# Patient Record
Sex: Female | Born: 1970
Health system: Southern US, Community
[De-identification: ages and names within clinical notes are randomized; demographics above are authoritative.]

## PROBLEM LIST (undated history)

## (undated) DIAGNOSIS — E039 Hypothyroidism, unspecified: Secondary | ICD-10-CM

## (undated) HISTORY — PX: FOOT SURGERY: SHX648

---

## 2001-10-31 ENCOUNTER — Other Ambulatory Visit: Admission: RE | Admit: 2001-10-31 | Discharge: 2001-10-31 | Payer: Self-pay | Admitting: Family Medicine

## 2003-11-23 ENCOUNTER — Other Ambulatory Visit: Admission: RE | Admit: 2003-11-23 | Discharge: 2003-11-23 | Payer: Self-pay | Admitting: Family Medicine

## 2004-12-23 ENCOUNTER — Other Ambulatory Visit: Admission: RE | Admit: 2004-12-23 | Discharge: 2004-12-23 | Payer: Self-pay | Admitting: Family Medicine

## 2006-02-05 ENCOUNTER — Other Ambulatory Visit: Admission: RE | Admit: 2006-02-05 | Discharge: 2006-02-05 | Payer: Self-pay | Admitting: Family Medicine

## 2007-03-18 ENCOUNTER — Other Ambulatory Visit: Admission: RE | Admit: 2007-03-18 | Discharge: 2007-03-18 | Payer: Self-pay | Admitting: Family Medicine

## 2008-03-31 ENCOUNTER — Other Ambulatory Visit: Admission: RE | Admit: 2008-03-31 | Discharge: 2008-03-31 | Payer: Self-pay | Admitting: Family Medicine

## 2009-05-03 ENCOUNTER — Other Ambulatory Visit: Admission: RE | Admit: 2009-05-03 | Discharge: 2009-05-03 | Payer: Self-pay | Admitting: Family Medicine

## 2009-05-25 ENCOUNTER — Encounter: Admission: RE | Admit: 2009-05-25 | Discharge: 2009-05-25 | Payer: Self-pay | Admitting: Family Medicine

## 2009-06-09 ENCOUNTER — Encounter: Admission: RE | Admit: 2009-06-09 | Discharge: 2009-06-09 | Payer: Self-pay | Admitting: Family Medicine

## 2009-06-09 ENCOUNTER — Encounter (INDEPENDENT_AMBULATORY_CARE_PROVIDER_SITE_OTHER): Payer: Self-pay | Admitting: Interventional Radiology

## 2009-06-09 ENCOUNTER — Other Ambulatory Visit: Admission: RE | Admit: 2009-06-09 | Discharge: 2009-06-09 | Payer: Self-pay | Admitting: Interventional Radiology

## 2009-11-02 ENCOUNTER — Other Ambulatory Visit: Admission: RE | Admit: 2009-11-02 | Discharge: 2009-11-02 | Payer: Self-pay | Admitting: Family Medicine

## 2010-03-02 IMAGING — US US BIOPSY
1 series · 6 of 6 positions shown · non-contrast
Comparison: none

CLINICAL DATA: Dominant left thyroid solid nodule.

ULTRASOUND-GUIDED THYROID ASPIRATION BIOPSY
TECHNIQUE: Survey ultrasound was performed and the dominant lesion
in the left lobe was localized.  An appropriate skin entry site was
determined.  Skin was marked, then prepped with Betadine, draped in
usual sterile fashion, and infiltrated locally with 1% lidocaine.
Under real-time ultrasound guidance, 4  passes were made into the
lesion with 25 gauge needles.  The patient tolerated procedure
well, with no immediate complications.
IMPRESSION
1.  Technically successful ultrasound-guided thyroid aspiration
biopsy

[Series 1: us biopsy · 6 acquisitions, 6 frames shown]
[im 1/6]
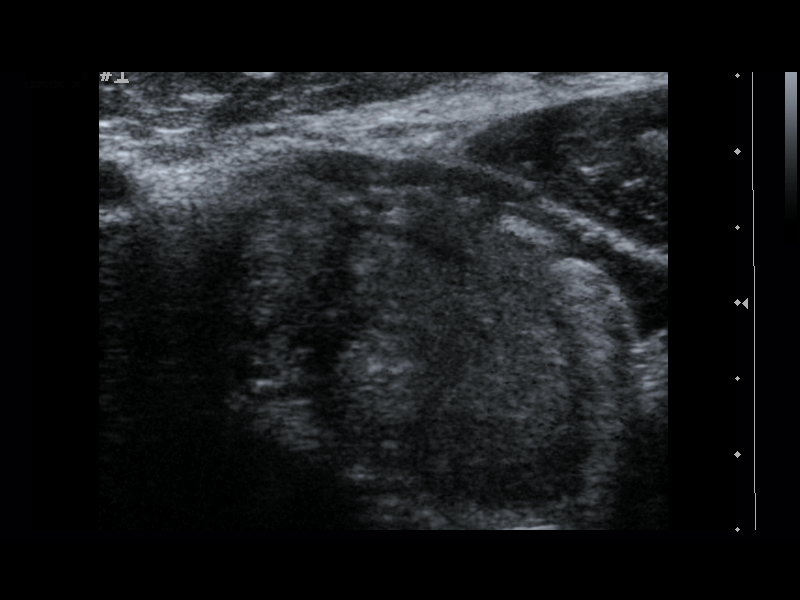
[im 2/6]
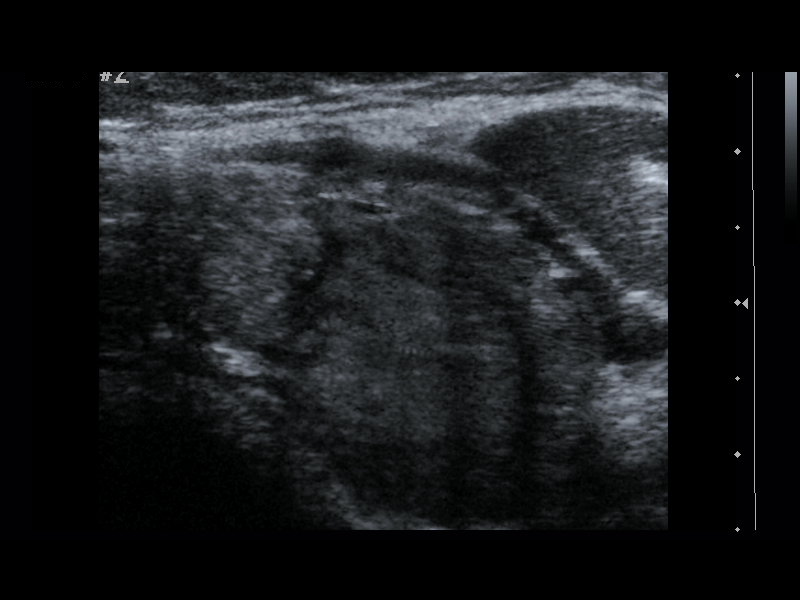
[im 3/6]
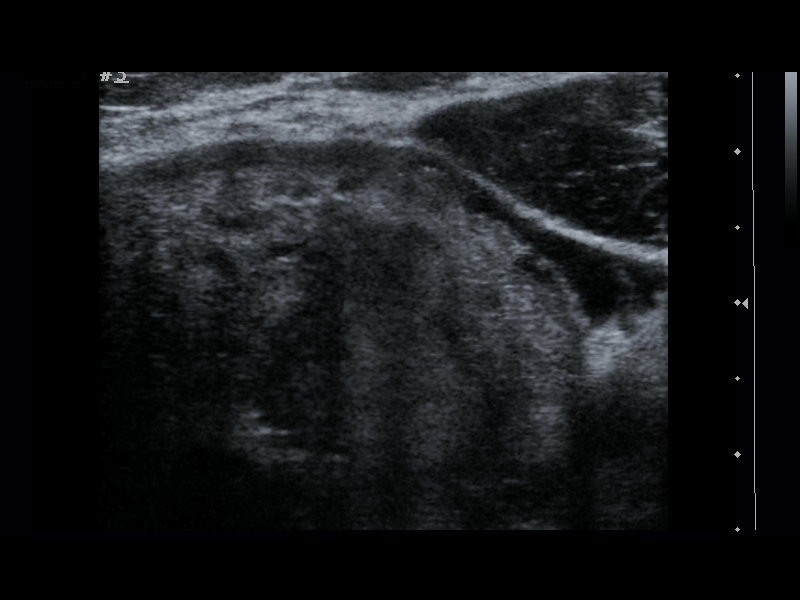
[im 4/6]
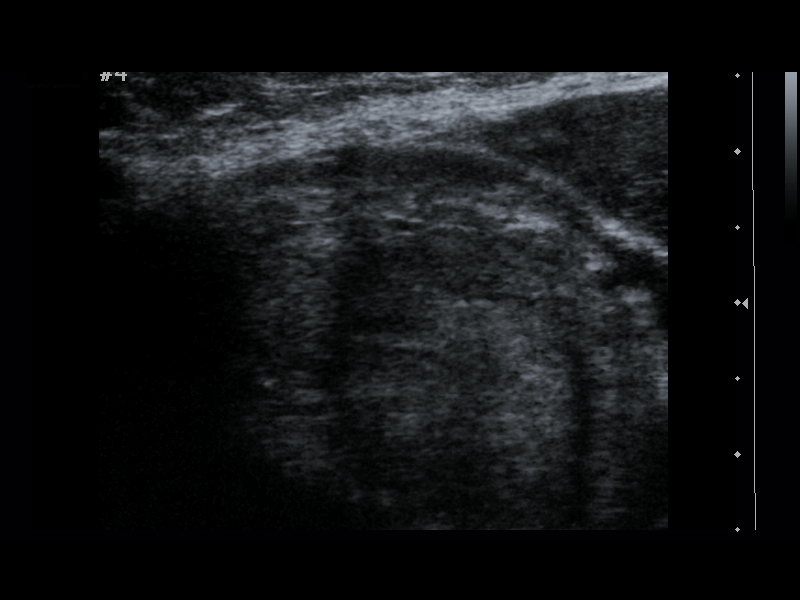
[im 5/6]
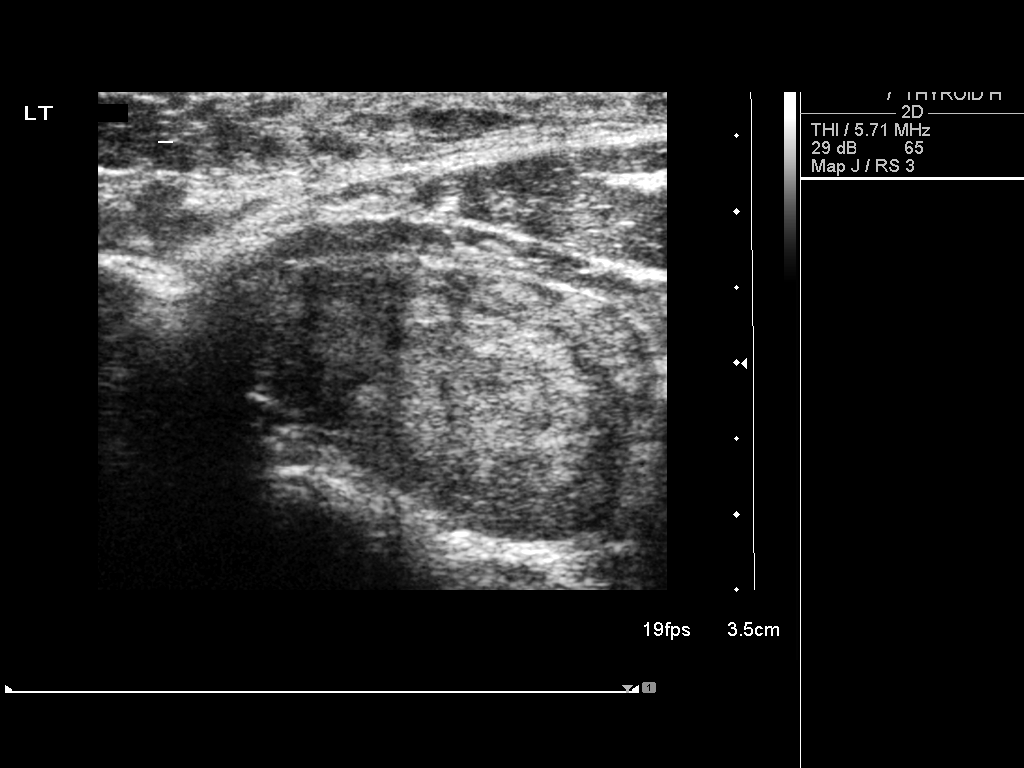
[im 6/6]
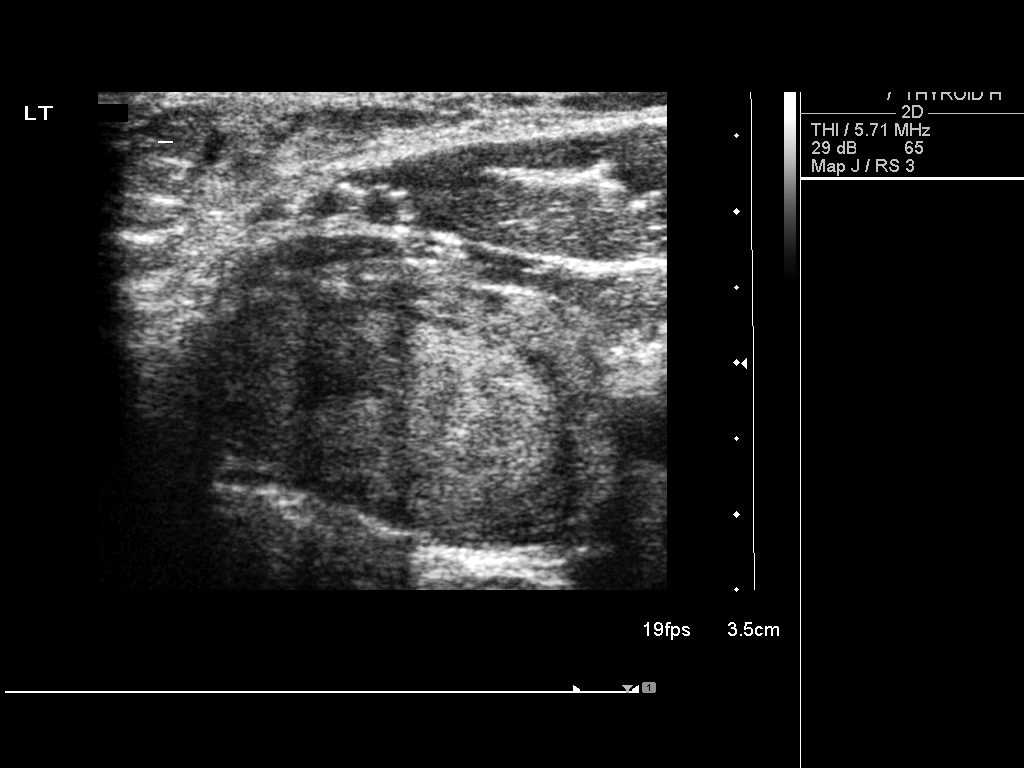

[6 of 6 positions shown; findings below may reference images not displayed]

## 2010-05-04 ENCOUNTER — Other Ambulatory Visit: Admission: RE | Admit: 2010-05-04 | Discharge: 2010-05-04 | Payer: Self-pay | Admitting: Family Medicine

## 2011-05-08 ENCOUNTER — Other Ambulatory Visit (HOSPITAL_COMMUNITY)
Admission: RE | Admit: 2011-05-08 | Discharge: 2011-05-08 | Disposition: A | Discharge status: Home / Self Care | Payer: BC Managed Care – PPO | Source: Ambulatory Visit | Attending: Family Medicine | Admitting: Family Medicine

## 2011-05-08 ENCOUNTER — Other Ambulatory Visit: Payer: Self-pay | Admitting: Physician Assistant

## 2011-05-08 DIAGNOSIS — Z124 Encounter for screening for malignant neoplasm of cervix: Secondary | ICD-10-CM | POA: Insufficient documentation

## 2011-05-11 ENCOUNTER — Other Ambulatory Visit: Payer: Self-pay | Admitting: Physician Assistant

## 2011-05-11 DIAGNOSIS — Z1231 Encounter for screening mammogram for malignant neoplasm of breast: Secondary | ICD-10-CM

## 2011-05-29 ENCOUNTER — Ambulatory Visit
Admission: RE | Admit: 2011-05-29 | Discharge: 2011-05-29 | Disposition: A | Discharge status: Home / Self Care | Payer: BC Managed Care – PPO | Source: Ambulatory Visit | Attending: Physician Assistant | Admitting: Physician Assistant

## 2011-05-29 DIAGNOSIS — Z1231 Encounter for screening mammogram for malignant neoplasm of breast: Secondary | ICD-10-CM

## 2011-05-31 ENCOUNTER — Other Ambulatory Visit: Payer: Self-pay | Admitting: Physician Assistant

## 2011-05-31 DIAGNOSIS — R928 Other abnormal and inconclusive findings on diagnostic imaging of breast: Secondary | ICD-10-CM

## 2011-06-06 ENCOUNTER — Ambulatory Visit
Admission: RE | Admit: 2011-06-06 | Discharge: 2011-06-06 | Disposition: A | Discharge status: Home / Self Care | Payer: BC Managed Care – PPO | Source: Ambulatory Visit | Attending: Physician Assistant | Admitting: Physician Assistant

## 2011-06-06 DIAGNOSIS — R928 Other abnormal and inconclusive findings on diagnostic imaging of breast: Secondary | ICD-10-CM

## 2012-05-16 ENCOUNTER — Other Ambulatory Visit (HOSPITAL_COMMUNITY)
Admission: RE | Admit: 2012-05-16 | Discharge: 2012-05-16 | Disposition: A | Discharge status: Home / Self Care | Payer: BC Managed Care – PPO | Source: Ambulatory Visit | Attending: Family Medicine | Admitting: Family Medicine

## 2012-05-16 ENCOUNTER — Other Ambulatory Visit: Payer: Self-pay | Admitting: Physician Assistant

## 2012-05-16 ENCOUNTER — Other Ambulatory Visit: Payer: Self-pay | Admitting: Family Medicine

## 2012-05-16 DIAGNOSIS — Z1231 Encounter for screening mammogram for malignant neoplasm of breast: Secondary | ICD-10-CM

## 2012-05-16 DIAGNOSIS — Z124 Encounter for screening for malignant neoplasm of cervix: Secondary | ICD-10-CM | POA: Insufficient documentation

## 2012-06-10 ENCOUNTER — Ambulatory Visit
Admission: RE | Admit: 2012-06-10 | Discharge: 2012-06-10 | Disposition: A | Discharge status: Home / Self Care | Payer: BC Managed Care – PPO | Source: Ambulatory Visit | Attending: Family Medicine | Admitting: Family Medicine

## 2012-06-10 DIAGNOSIS — Z1231 Encounter for screening mammogram for malignant neoplasm of breast: Secondary | ICD-10-CM

## 2013-05-07 ENCOUNTER — Other Ambulatory Visit: Payer: Self-pay

## 2013-05-07 DIAGNOSIS — Z1231 Encounter for screening mammogram for malignant neoplasm of breast: Secondary | ICD-10-CM

## 2013-06-06 ENCOUNTER — Other Ambulatory Visit: Payer: Self-pay | Admitting: Physician Assistant

## 2013-06-06 ENCOUNTER — Other Ambulatory Visit (HOSPITAL_COMMUNITY)
Admission: RE | Admit: 2013-06-06 | Discharge: 2013-06-06 | Disposition: A | Discharge status: Home / Self Care | Payer: BC Managed Care – PPO | Source: Ambulatory Visit | Attending: Family Medicine | Admitting: Family Medicine

## 2013-06-06 DIAGNOSIS — Z124 Encounter for screening for malignant neoplasm of cervix: Secondary | ICD-10-CM | POA: Insufficient documentation

## 2013-06-13 ENCOUNTER — Ambulatory Visit
Admission: RE | Admit: 2013-06-13 | Discharge: 2013-06-13 | Disposition: A | Discharge status: Home / Self Care | Payer: BC Managed Care – PPO | Source: Ambulatory Visit

## 2013-06-13 DIAGNOSIS — Z1231 Encounter for screening mammogram for malignant neoplasm of breast: Secondary | ICD-10-CM

## 2014-05-04 ENCOUNTER — Other Ambulatory Visit: Payer: Self-pay

## 2014-05-04 DIAGNOSIS — Z1231 Encounter for screening mammogram for malignant neoplasm of breast: Secondary | ICD-10-CM

## 2014-06-09 ENCOUNTER — Other Ambulatory Visit: Payer: Self-pay | Admitting: Physician Assistant

## 2014-06-09 ENCOUNTER — Other Ambulatory Visit (HOSPITAL_COMMUNITY)
Admission: RE | Admit: 2014-06-09 | Discharge: 2014-06-09 | Disposition: A | Discharge status: Home / Self Care | Payer: BC Managed Care – PPO | Source: Ambulatory Visit | Attending: Family Medicine | Admitting: Family Medicine

## 2014-06-09 DIAGNOSIS — Z124 Encounter for screening for malignant neoplasm of cervix: Secondary | ICD-10-CM | POA: Insufficient documentation

## 2014-06-10 LAB — CYTOLOGY - PAP

## 2014-06-15 ENCOUNTER — Ambulatory Visit
Admission: RE | Admit: 2014-06-15 | Discharge: 2014-06-15 | Disposition: A | Discharge status: Home / Self Care | Payer: BC Managed Care – PPO | Source: Ambulatory Visit

## 2014-06-15 DIAGNOSIS — Z1231 Encounter for screening mammogram for malignant neoplasm of breast: Secondary | ICD-10-CM

## 2015-05-07 ENCOUNTER — Other Ambulatory Visit: Payer: Self-pay

## 2015-05-07 DIAGNOSIS — Z1231 Encounter for screening mammogram for malignant neoplasm of breast: Secondary | ICD-10-CM

## 2015-06-17 ENCOUNTER — Ambulatory Visit
Admission: RE | Admit: 2015-06-17 | Discharge: 2015-06-17 | Disposition: A | Discharge status: Home / Self Care | Payer: BLUE CROSS/BLUE SHIELD | Source: Ambulatory Visit

## 2015-06-17 DIAGNOSIS — Z1231 Encounter for screening mammogram for malignant neoplasm of breast: Secondary | ICD-10-CM

## 2016-05-23 ENCOUNTER — Other Ambulatory Visit: Payer: Self-pay | Admitting: Physician Assistant

## 2016-05-23 DIAGNOSIS — Z1231 Encounter for screening mammogram for malignant neoplasm of breast: Secondary | ICD-10-CM

## 2016-06-15 ENCOUNTER — Other Ambulatory Visit (HOSPITAL_COMMUNITY)
Admission: RE | Admit: 2016-06-15 | Discharge: 2016-06-15 | Disposition: A | Discharge status: Home / Self Care | Payer: BLUE CROSS/BLUE SHIELD | Source: Ambulatory Visit | Attending: Family Medicine | Admitting: Family Medicine

## 2016-06-15 ENCOUNTER — Other Ambulatory Visit: Payer: Self-pay | Admitting: Physician Assistant

## 2016-06-15 DIAGNOSIS — Z124 Encounter for screening for malignant neoplasm of cervix: Secondary | ICD-10-CM | POA: Diagnosis not present

## 2016-06-20 ENCOUNTER — Ambulatory Visit
Admission: RE | Admit: 2016-06-20 | Discharge: 2016-06-20 | Disposition: A | Discharge status: Home / Self Care | Payer: BLUE CROSS/BLUE SHIELD | Source: Ambulatory Visit | Attending: Physician Assistant | Admitting: Physician Assistant

## 2016-06-20 DIAGNOSIS — Z1231 Encounter for screening mammogram for malignant neoplasm of breast: Secondary | ICD-10-CM

## 2016-06-20 LAB — CYTOLOGY - PAP

## 2017-05-14 ENCOUNTER — Other Ambulatory Visit: Payer: Self-pay | Admitting: Physician Assistant

## 2017-05-14 DIAGNOSIS — Z1231 Encounter for screening mammogram for malignant neoplasm of breast: Secondary | ICD-10-CM

## 2017-05-22 ENCOUNTER — Ambulatory Visit: Payer: BLUE CROSS/BLUE SHIELD

## 2017-06-22 ENCOUNTER — Ambulatory Visit
Admission: RE | Admit: 2017-06-22 | Discharge: 2017-06-22 | Disposition: A | Discharge status: Home / Self Care | Payer: BLUE CROSS/BLUE SHIELD | Source: Ambulatory Visit | Attending: Physician Assistant | Admitting: Physician Assistant

## 2017-06-22 DIAGNOSIS — Z1231 Encounter for screening mammogram for malignant neoplasm of breast: Secondary | ICD-10-CM

## 2018-05-15 ENCOUNTER — Other Ambulatory Visit: Payer: Self-pay | Admitting: Physician Assistant

## 2018-05-15 DIAGNOSIS — Z1231 Encounter for screening mammogram for malignant neoplasm of breast: Secondary | ICD-10-CM

## 2018-06-27 ENCOUNTER — Ambulatory Visit
Admission: RE | Admit: 2018-06-27 | Discharge: 2018-06-27 | Disposition: A | Discharge status: Home / Self Care | Payer: BLUE CROSS/BLUE SHIELD | Source: Ambulatory Visit | Attending: Physician Assistant | Admitting: Physician Assistant

## 2018-06-27 DIAGNOSIS — Z1231 Encounter for screening mammogram for malignant neoplasm of breast: Secondary | ICD-10-CM | POA: Diagnosis not present

## 2018-07-09 DIAGNOSIS — E039 Hypothyroidism, unspecified: Secondary | ICD-10-CM | POA: Diagnosis not present

## 2018-07-09 DIAGNOSIS — N912 Amenorrhea, unspecified: Secondary | ICD-10-CM | POA: Diagnosis not present

## 2018-07-09 DIAGNOSIS — R7302 Impaired glucose tolerance (oral): Secondary | ICD-10-CM | POA: Diagnosis not present

## 2018-07-09 DIAGNOSIS — Z13220 Encounter for screening for lipoid disorders: Secondary | ICD-10-CM | POA: Diagnosis not present

## 2018-07-09 DIAGNOSIS — Z23 Encounter for immunization: Secondary | ICD-10-CM | POA: Diagnosis not present

## 2018-07-09 DIAGNOSIS — E7800 Pure hypercholesterolemia, unspecified: Secondary | ICD-10-CM | POA: Diagnosis not present

## 2018-07-09 DIAGNOSIS — Z0000 Encounter for general adult medical examination without abnormal findings: Secondary | ICD-10-CM | POA: Diagnosis not present

## 2018-12-19 DIAGNOSIS — H40013 Open angle with borderline findings, low risk, bilateral: Secondary | ICD-10-CM | POA: Diagnosis not present

## 2018-12-26 DIAGNOSIS — H40013 Open angle with borderline findings, low risk, bilateral: Secondary | ICD-10-CM | POA: Diagnosis not present

## 2019-01-07 DIAGNOSIS — E039 Hypothyroidism, unspecified: Secondary | ICD-10-CM | POA: Diagnosis not present

## 2019-01-07 DIAGNOSIS — R7302 Impaired glucose tolerance (oral): Secondary | ICD-10-CM | POA: Diagnosis not present

## 2019-01-07 DIAGNOSIS — E7800 Pure hypercholesterolemia, unspecified: Secondary | ICD-10-CM | POA: Diagnosis not present

## 2019-05-19 ENCOUNTER — Other Ambulatory Visit: Payer: Self-pay | Admitting: Physician Assistant

## 2019-05-19 DIAGNOSIS — Z1231 Encounter for screening mammogram for malignant neoplasm of breast: Secondary | ICD-10-CM

## 2019-07-03 ENCOUNTER — Other Ambulatory Visit: Payer: Self-pay

## 2019-07-03 ENCOUNTER — Ambulatory Visit
Admission: RE | Admit: 2019-07-03 | Discharge: 2019-07-03 | Disposition: A | Discharge status: Home / Self Care | Payer: BLUE CROSS/BLUE SHIELD | Source: Ambulatory Visit | Attending: Physician Assistant | Admitting: Physician Assistant

## 2019-07-03 DIAGNOSIS — Z1231 Encounter for screening mammogram for malignant neoplasm of breast: Secondary | ICD-10-CM

## 2019-07-11 DIAGNOSIS — E039 Hypothyroidism, unspecified: Secondary | ICD-10-CM | POA: Diagnosis not present

## 2019-07-11 DIAGNOSIS — R7302 Impaired glucose tolerance (oral): Secondary | ICD-10-CM | POA: Diagnosis not present

## 2019-07-11 DIAGNOSIS — E7800 Pure hypercholesterolemia, unspecified: Secondary | ICD-10-CM | POA: Diagnosis not present

## 2019-07-11 DIAGNOSIS — Z0000 Encounter for general adult medical examination without abnormal findings: Secondary | ICD-10-CM | POA: Diagnosis not present

## 2019-07-18 DIAGNOSIS — E039 Hypothyroidism, unspecified: Secondary | ICD-10-CM | POA: Diagnosis not present

## 2019-07-18 DIAGNOSIS — E7800 Pure hypercholesterolemia, unspecified: Secondary | ICD-10-CM | POA: Diagnosis not present

## 2019-07-18 DIAGNOSIS — Z23 Encounter for immunization: Secondary | ICD-10-CM | POA: Diagnosis not present

## 2019-07-18 DIAGNOSIS — R7302 Impaired glucose tolerance (oral): Secondary | ICD-10-CM | POA: Diagnosis not present

## 2019-09-22 DIAGNOSIS — Z1159 Encounter for screening for other viral diseases: Secondary | ICD-10-CM | POA: Diagnosis not present

## 2019-09-25 DIAGNOSIS — K635 Polyp of colon: Secondary | ICD-10-CM | POA: Diagnosis not present

## 2019-09-25 DIAGNOSIS — Z1211 Encounter for screening for malignant neoplasm of colon: Secondary | ICD-10-CM | POA: Diagnosis not present

## 2019-12-17 DIAGNOSIS — H40013 Open angle with borderline findings, low risk, bilateral: Secondary | ICD-10-CM | POA: Diagnosis not present

## 2020-01-08 DIAGNOSIS — R7302 Impaired glucose tolerance (oral): Secondary | ICD-10-CM | POA: Diagnosis not present

## 2020-01-08 DIAGNOSIS — E039 Hypothyroidism, unspecified: Secondary | ICD-10-CM | POA: Diagnosis not present

## 2020-01-08 DIAGNOSIS — E7800 Pure hypercholesterolemia, unspecified: Secondary | ICD-10-CM | POA: Diagnosis not present

## 2020-06-24 ENCOUNTER — Other Ambulatory Visit: Payer: Self-pay | Admitting: Physician Assistant

## 2020-06-24 DIAGNOSIS — Z1231 Encounter for screening mammogram for malignant neoplasm of breast: Secondary | ICD-10-CM

## 2020-07-08 ENCOUNTER — Other Ambulatory Visit: Payer: Self-pay

## 2020-07-08 ENCOUNTER — Ambulatory Visit
Admission: RE | Admit: 2020-07-08 | Discharge: 2020-07-08 | Disposition: A | Discharge status: Home / Self Care | Payer: BC Managed Care – PPO | Source: Ambulatory Visit | Attending: Physician Assistant | Admitting: Physician Assistant

## 2020-07-08 DIAGNOSIS — Z1231 Encounter for screening mammogram for malignant neoplasm of breast: Secondary | ICD-10-CM | POA: Diagnosis not present

## 2020-07-13 ENCOUNTER — Other Ambulatory Visit (HOSPITAL_COMMUNITY)
Admission: RE | Admit: 2020-07-13 | Discharge: 2020-07-13 | Disposition: A | Discharge status: Home / Self Care | Payer: BC Managed Care – PPO | Source: Ambulatory Visit | Attending: Physician Assistant | Admitting: Physician Assistant

## 2020-07-13 DIAGNOSIS — Z124 Encounter for screening for malignant neoplasm of cervix: Secondary | ICD-10-CM | POA: Diagnosis not present

## 2020-07-13 DIAGNOSIS — E039 Hypothyroidism, unspecified: Secondary | ICD-10-CM | POA: Diagnosis not present

## 2020-07-13 DIAGNOSIS — Z23 Encounter for immunization: Secondary | ICD-10-CM | POA: Diagnosis not present

## 2020-07-13 DIAGNOSIS — E7800 Pure hypercholesterolemia, unspecified: Secondary | ICD-10-CM | POA: Diagnosis not present

## 2020-07-13 DIAGNOSIS — Z0000 Encounter for general adult medical examination without abnormal findings: Secondary | ICD-10-CM | POA: Diagnosis not present

## 2020-07-13 DIAGNOSIS — R7302 Impaired glucose tolerance (oral): Secondary | ICD-10-CM | POA: Diagnosis not present

## 2020-07-15 LAB — CYTOLOGY - PAP
Diagnosis: NEGATIVE
Diagnosis: REACTIVE

## 2020-09-14 DIAGNOSIS — Z23 Encounter for immunization: Secondary | ICD-10-CM | POA: Diagnosis not present

## 2021-01-11 DIAGNOSIS — E7800 Pure hypercholesterolemia, unspecified: Secondary | ICD-10-CM | POA: Diagnosis not present

## 2021-01-11 DIAGNOSIS — E039 Hypothyroidism, unspecified: Secondary | ICD-10-CM | POA: Diagnosis not present

## 2021-01-11 DIAGNOSIS — R7302 Impaired glucose tolerance (oral): Secondary | ICD-10-CM | POA: Diagnosis not present

## 2021-04-18 DIAGNOSIS — R7302 Impaired glucose tolerance (oral): Secondary | ICD-10-CM | POA: Diagnosis not present

## 2021-07-14 ENCOUNTER — Other Ambulatory Visit: Payer: Self-pay | Admitting: Physician Assistant

## 2021-07-14 DIAGNOSIS — E7800 Pure hypercholesterolemia, unspecified: Secondary | ICD-10-CM | POA: Diagnosis not present

## 2021-07-14 DIAGNOSIS — E039 Hypothyroidism, unspecified: Secondary | ICD-10-CM | POA: Diagnosis not present

## 2021-07-14 DIAGNOSIS — Z0000 Encounter for general adult medical examination without abnormal findings: Secondary | ICD-10-CM | POA: Diagnosis not present

## 2021-07-14 DIAGNOSIS — R7302 Impaired glucose tolerance (oral): Secondary | ICD-10-CM | POA: Diagnosis not present

## 2021-07-14 DIAGNOSIS — Z1231 Encounter for screening mammogram for malignant neoplasm of breast: Secondary | ICD-10-CM

## 2021-07-14 DIAGNOSIS — Z23 Encounter for immunization: Secondary | ICD-10-CM | POA: Diagnosis not present

## 2021-08-18 ENCOUNTER — Other Ambulatory Visit: Payer: Self-pay

## 2021-08-18 ENCOUNTER — Ambulatory Visit
Admission: RE | Admit: 2021-08-18 | Discharge: 2021-08-18 | Disposition: A | Discharge status: Home / Self Care | Payer: BC Managed Care – PPO | Source: Ambulatory Visit | Attending: Physician Assistant | Admitting: Physician Assistant

## 2021-08-18 DIAGNOSIS — Z1231 Encounter for screening mammogram for malignant neoplasm of breast: Secondary | ICD-10-CM | POA: Diagnosis not present

## 2021-08-25 ENCOUNTER — Other Ambulatory Visit: Payer: Self-pay | Admitting: Physician Assistant

## 2021-08-25 DIAGNOSIS — R928 Other abnormal and inconclusive findings on diagnostic imaging of breast: Secondary | ICD-10-CM

## 2021-09-06 ENCOUNTER — Other Ambulatory Visit: Payer: Self-pay | Admitting: Physician Assistant

## 2021-09-06 ENCOUNTER — Ambulatory Visit
Admission: RE | Admit: 2021-09-06 | Discharge: 2021-09-06 | Disposition: A | Discharge status: Home / Self Care | Payer: BC Managed Care – PPO | Source: Ambulatory Visit | Attending: Physician Assistant | Admitting: Physician Assistant

## 2021-09-06 ENCOUNTER — Other Ambulatory Visit: Payer: Self-pay

## 2021-09-06 DIAGNOSIS — R928 Other abnormal and inconclusive findings on diagnostic imaging of breast: Secondary | ICD-10-CM

## 2021-09-06 DIAGNOSIS — R2231 Localized swelling, mass and lump, right upper limb: Secondary | ICD-10-CM | POA: Diagnosis not present

## 2021-09-15 ENCOUNTER — Other Ambulatory Visit: Payer: Self-pay

## 2021-09-15 ENCOUNTER — Ambulatory Visit
Admission: RE | Admit: 2021-09-15 | Discharge: 2021-09-15 | Disposition: A | Discharge status: Home / Self Care | Payer: BC Managed Care – PPO | Source: Ambulatory Visit | Attending: Physician Assistant | Admitting: Physician Assistant

## 2021-09-15 DIAGNOSIS — R928 Other abnormal and inconclusive findings on diagnostic imaging of breast: Secondary | ICD-10-CM

## 2021-09-15 DIAGNOSIS — R2231 Localized swelling, mass and lump, right upper limb: Secondary | ICD-10-CM | POA: Diagnosis not present

## 2021-09-15 DIAGNOSIS — I899 Noninfective disorder of lymphatic vessels and lymph nodes, unspecified: Secondary | ICD-10-CM | POA: Diagnosis not present

## 2021-10-26 ENCOUNTER — Other Ambulatory Visit: Payer: Self-pay | Admitting: Surgery

## 2021-10-26 DIAGNOSIS — R2231 Localized swelling, mass and lump, right upper limb: Secondary | ICD-10-CM | POA: Diagnosis not present

## 2021-11-28 ENCOUNTER — Encounter (HOSPITAL_BASED_OUTPATIENT_CLINIC_OR_DEPARTMENT_OTHER): Payer: Self-pay | Admitting: Surgery

## 2021-12-06 MED ORDER — ENSURE PRE-SURGERY PO LIQD: 296.0000 mL | Freq: Once | ORAL | Status: DC

## 2021-12-06 NOTE — Anesthesia Preprocedure Evaluation (Addendum)
Anesthesia Evaluation  Patient identified by MRN, date of birth, ID band Patient awake    Reviewed: Allergy & Precautions, NPO status , Patient's Chart, lab work & pertinent test results  History of Anesthesia Complications Negative for: history of anesthetic complications  Airway Mallampati: II  TM Distance: >3 FB Neck ROM: Full    Dental no notable dental hx.    Pulmonary neg pulmonary ROS,    Pulmonary exam normal        Cardiovascular negative cardio ROS Normal cardiovascular exam     Neuro/Psych negative neurological ROS  negative psych ROS   GI/Hepatic negative GI ROS, Neg liver ROS,   Endo/Other  Hypothyroidism   Renal/GU negative Renal ROS  negative genitourinary   Musculoskeletal negative musculoskeletal ROS (+)   Abdominal   Peds  Hematology negative hematology ROS (+)   Anesthesia Other Findings Right axillary mass  Reproductive/Obstetrics negative OB ROS                            Anesthesia Physical Anesthesia Plan  ASA: 2  Anesthesia Plan: General   Post-op Pain Management: Tylenol PO (pre-op)   Induction: Intravenous  PONV Risk Score and Plan: 3 and Treatment may vary due to age or medical condition, Ondansetron, Dexamethasone and Midazolam  Airway Management Planned: LMA  Additional Equipment: None  Intra-op Plan:   Post-operative Plan: Extubation in OR  Informed Consent: I have reviewed the patients History and Physical, chart, labs and discussed the procedure including the risks, benefits and alternatives for the proposed anesthesia with the patient or authorized representative who has indicated his/her understanding and acceptance.     Dental advisory given  Plan Discussed with: CRNA  Anesthesia Plan Comments:        Anesthesia Quick Evaluation

## 2021-12-06 NOTE — Progress Notes (Signed)

## 2021-12-06 NOTE — H&P (Signed)
°  REFERRING PHYSICIAN: Alyson Ingles, PA  PROVIDER: Wayne Both, MD  MRN: [id number] DOB: [birth year]  Subjective   Chief Complaint: Sebaceous Cyst   History of Present Illness: Amy Weaver is a 51 y.o. female who is seen  as an office consultation at the request of Dr. Sherlyn Lick for evaluation of Sebaceous Cyst .   This is a 51 year old female presents with a right axillary mass. She reports that the masses been present for several years but is now getting larger. It was visualized on most recent screening mammography. It was found on mammogram and ultrasound to be 5.2 cm in size. She underwent a biopsy of the mass which was fairly inconclusive. She has had no problems with her breasts. She reports this area is never drained or become erythematous. She denies fevers or chills  Review of Systems: A complete review of systems was obtained from the patient. I have reviewed this information and discussed as appropriate with the patient. See HPI as well for other ROS.  ROS   Medical History: Past Medical History:  Diagnosis Date   Thyroid disease   There is no problem list on file for this patient.  History reviewed. No pertinent surgical history.   No Known Allergies  Current Outpatient Medications on File Prior to Visit  Medication Sig Dispense Refill   atorvastatin (LIPITOR) 20 MG tablet Take 1 tablet by mouth once daily   levothyroxine (SYNTHROID) 125 MCG tablet TAKE 1 TABLET EVERY MORNING ON AN EMPTY STOMACH ONCE A DAY   No current facility-administered medications on file prior to visit.   Family History  Problem Relation Age of Onset   Breast cancer Mother    Social History   Tobacco Use  Smoking Status Never  Smokeless Tobacco Never    Social History   Socioeconomic History   Marital status: Married  Tobacco Use   Smoking status: Never   Smokeless tobacco: Never  Substance and Sexual Activity   Alcohol use: Not Currently   Drug use: Not  Currently   Objective:   Vitals:   BP: 128/82  Pulse: 82  Weight: 94.3 kg (207 lb 12.8 oz)  Height: 167.6 cm (5\' 6" )   Body mass index is 33.54 kg/m.  Physical Exam   She appears well on exam  In the right axilla posteriorly there is a 5 cm mass which is mildly mobile. There are no skin changes. It is nonpulsatile. There are no other axillary masses  Lungs clear CV RRR  Labs, Imaging and Diagnostic Testing: I reviewed her mammogram, ultrasound, and pathology results  Assessment and Plan:   Diagnoses and all orders for this visit:  Axillary mass, right    I have discussed the findings in detail with the patient and her family. Given the size of this mass and its significant increase over time especially based on mammogram, its discomfort, pathology, etc., excision of the mass is strongly recommended for complete histologic evaluation to rule out a malignancy. I discussed the procedure with her in detail. We discussed the risk which includes but is not limited to bleeding, infection, recurrence, the need for other procedures if malignancy is found, cardiopulmonary issues, postoperative recovery, etc. They understand and wish to proceed with surgery which will be scheduled.

## 2021-12-07 ENCOUNTER — Encounter (HOSPITAL_BASED_OUTPATIENT_CLINIC_OR_DEPARTMENT_OTHER): Payer: Self-pay | Admitting: Surgery

## 2021-12-07 ENCOUNTER — Other Ambulatory Visit: Payer: Self-pay

## 2021-12-07 ENCOUNTER — Ambulatory Visit (HOSPITAL_BASED_OUTPATIENT_CLINIC_OR_DEPARTMENT_OTHER)
Admission: RE | Admit: 2021-12-07 | Discharge: 2021-12-07 | Disposition: A | Discharge status: Home / Self Care | Payer: BC Managed Care – PPO | Attending: Surgery | Admitting: Surgery

## 2021-12-07 ENCOUNTER — Encounter (HOSPITAL_BASED_OUTPATIENT_CLINIC_OR_DEPARTMENT_OTHER)
Admission: RE | Disposition: A | Discharge status: Home / Self Care | Payer: Self-pay | Source: Home / Self Care | Attending: Surgery

## 2021-12-07 ENCOUNTER — Ambulatory Visit (HOSPITAL_BASED_OUTPATIENT_CLINIC_OR_DEPARTMENT_OTHER): Payer: BC Managed Care – PPO | Admitting: Anesthesiology

## 2021-12-07 DIAGNOSIS — L720 Epidermal cyst: Secondary | ICD-10-CM | POA: Insufficient documentation

## 2021-12-07 DIAGNOSIS — R222 Localized swelling, mass and lump, trunk: Secondary | ICD-10-CM | POA: Diagnosis not present

## 2021-12-07 DIAGNOSIS — E039 Hypothyroidism, unspecified: Secondary | ICD-10-CM | POA: Diagnosis not present

## 2021-12-07 DIAGNOSIS — L723 Sebaceous cyst: Secondary | ICD-10-CM | POA: Diagnosis not present

## 2021-12-07 HISTORY — PX: MASS EXCISION: SHX2000

## 2021-12-07 HISTORY — DX: Hypothyroidism, unspecified: E03.9

## 2021-12-07 SURGERY — EXCISION MASS
Anesthesia: General | Site: Axilla | Laterality: Right

## 2021-12-07 MED ORDER — FENTANYL CITRATE (PF) 100 MCG/2ML IJ SOLN
INTRAMUSCULAR | Status: AC
  Filled 2021-12-07: qty 2

## 2021-12-07 MED ORDER — BUPIVACAINE-EPINEPHRINE 0.5% -1:200000 IJ SOLN
INTRAMUSCULAR | Status: DC | PRN
  Administered 2021-12-07: 20 mL

## 2021-12-07 MED ORDER — EPHEDRINE SULFATE (PRESSORS) 50 MG/ML IJ SOLN
INTRAMUSCULAR | Status: DC | PRN
  Administered 2021-12-07: 10 mg via INTRAVENOUS

## 2021-12-07 MED ORDER — 0.9 % SODIUM CHLORIDE (POUR BTL) OPTIME
TOPICAL | Status: DC | PRN
  Administered 2021-12-07: 500 mL

## 2021-12-07 MED ORDER — ONDANSETRON HCL 4 MG/2ML IJ SOLN
INTRAMUSCULAR | Status: DC | PRN
  Administered 2021-12-07 (×2): 4 mg via INTRAVENOUS

## 2021-12-07 MED ORDER — PROPOFOL 500 MG/50ML IV EMUL
INTRAVENOUS | Status: AC
  Filled 2021-12-07: qty 100

## 2021-12-07 MED ORDER — ACETAMINOPHEN 500 MG PO TABS
ORAL_TABLET | ORAL | Status: AC
  Filled 2021-12-07: qty 2

## 2021-12-07 MED ORDER — TRAMADOL HCL 50 MG PO TABS: 50.0000 mg | ORAL_TABLET | Freq: Four times a day (QID) | ORAL | 0 refills | Status: AC | PRN

## 2021-12-07 MED ORDER — DEXAMETHASONE SODIUM PHOSPHATE 4 MG/ML IJ SOLN
INTRAMUSCULAR | Status: DC | PRN
  Administered 2021-12-07: 5 mg via INTRAVENOUS

## 2021-12-07 MED ORDER — ONDANSETRON HCL 4 MG/2ML IJ SOLN
INTRAMUSCULAR | Status: AC
  Filled 2021-12-07: qty 12

## 2021-12-07 MED ORDER — CHLORHEXIDINE GLUCONATE CLOTH 2 % EX PADS: 6.0000 | MEDICATED_PAD | Freq: Once | CUTANEOUS | Status: DC

## 2021-12-07 MED ORDER — CEFAZOLIN SODIUM-DEXTROSE 2-4 GM/100ML-% IV SOLN: 2.0000 g | INTRAVENOUS | Status: DC

## 2021-12-07 MED ORDER — DEXAMETHASONE SODIUM PHOSPHATE 10 MG/ML IJ SOLN
INTRAMUSCULAR | Status: AC
  Filled 2021-12-07: qty 2

## 2021-12-07 MED ORDER — OXYCODONE HCL 5 MG PO TABS: 5.0000 mg | ORAL_TABLET | Freq: Once | ORAL | Status: DC | PRN

## 2021-12-07 MED ORDER — LACTATED RINGERS IV SOLN: INTRAVENOUS | Status: DC

## 2021-12-07 MED ORDER — PHENYLEPHRINE HCL (PRESSORS) 10 MG/ML IV SOLN
INTRAVENOUS | Status: DC | PRN
  Administered 2021-12-07: 80 ug via INTRAVENOUS
  Administered 2021-12-07: 40 ug via INTRAVENOUS

## 2021-12-07 MED ORDER — LIDOCAINE 2% (20 MG/ML) 5 ML SYRINGE
INTRAMUSCULAR | Status: AC
  Filled 2021-12-07: qty 20

## 2021-12-07 MED ORDER — MIDAZOLAM HCL 2 MG/2ML IJ SOLN
INTRAMUSCULAR | Status: AC
  Filled 2021-12-07: qty 2

## 2021-12-07 MED ORDER — PROMETHAZINE HCL 25 MG/ML IJ SOLN: 6.2500 mg | INTRAMUSCULAR | Status: DC | PRN

## 2021-12-07 MED ORDER — BUPIVACAINE-EPINEPHRINE (PF) 0.5% -1:200000 IJ SOLN
INTRAMUSCULAR | Status: AC
  Filled 2021-12-07: qty 90

## 2021-12-07 MED ORDER — PROPOFOL 500 MG/50ML IV EMUL
INTRAVENOUS | Status: DC | PRN
  Administered 2021-12-07: 25 ug/kg/min via INTRAVENOUS

## 2021-12-07 MED ORDER — OXYCODONE HCL 5 MG/5ML PO SOLN: 5.0000 mg | Freq: Once | ORAL | Status: DC | PRN

## 2021-12-07 MED ORDER — FENTANYL CITRATE (PF) 100 MCG/2ML IJ SOLN: 25.0000 ug | INTRAMUSCULAR | Status: DC | PRN

## 2021-12-07 MED ORDER — ONDANSETRON HCL 4 MG/2ML IJ SOLN
INTRAMUSCULAR | Status: AC
  Filled 2021-12-07: qty 2

## 2021-12-07 MED ORDER — LIDOCAINE 2% (20 MG/ML) 5 ML SYRINGE
INTRAMUSCULAR | Status: DC | PRN
  Administered 2021-12-07: 100 mg via INTRAVENOUS

## 2021-12-07 MED ORDER — ACETAMINOPHEN 500 MG PO TABS
1000.0000 mg | ORAL_TABLET | ORAL | Status: AC
  Administered 2021-12-07: 1000 mg via ORAL

## 2021-12-07 MED ORDER — PROPOFOL 10 MG/ML IV BOLUS
INTRAVENOUS | Status: DC | PRN
  Administered 2021-12-07: 180 mg via INTRAVENOUS

## 2021-12-07 MED ORDER — FENTANYL CITRATE (PF) 100 MCG/2ML IJ SOLN
INTRAMUSCULAR | Status: DC | PRN
  Administered 2021-12-07 (×2): 50 ug via INTRAVENOUS

## 2021-12-07 MED ORDER — CEFAZOLIN SODIUM-DEXTROSE 2-4 GM/100ML-% IV SOLN
INTRAVENOUS | Status: AC
  Filled 2021-12-07: qty 100

## 2021-12-07 SURGICAL SUPPLY — 39 items
ADH SKN CLS APL DERMABOND .7 (GAUZE/BANDAGES/DRESSINGS) ×1
APL PRP STRL LF DISP 70% ISPRP (MISCELLANEOUS) ×1
BLADE SURG 15 STRL LF DISP TIS (BLADE) ×2 IMPLANT
BLADE SURG 15 STRL SS (BLADE) ×2
CANISTER SUCT 1200ML W/VALVE (MISCELLANEOUS) ×1 IMPLANT
CHLORAPREP W/TINT 26 (MISCELLANEOUS) ×3 IMPLANT
COVER BACK TABLE 60X90IN (DRAPES) ×3 IMPLANT
COVER MAYO STAND STRL (DRAPES) ×3 IMPLANT
DERMABOND ADVANCED (GAUZE/BANDAGES/DRESSINGS) ×1
DERMABOND ADVANCED .7 DNX12 (GAUZE/BANDAGES/DRESSINGS) ×2 IMPLANT
DRAPE LAPAROTOMY 100X72 PEDS (DRAPES) ×3 IMPLANT
DRAPE UTILITY XL STRL (DRAPES) ×3 IMPLANT
ELECT REM PT RETURN 9FT ADLT (ELECTROSURGICAL) ×2
ELECTRODE REM PT RTRN 9FT ADLT (ELECTROSURGICAL) ×2 IMPLANT
GLOVE SURG POLYISO LF SZ6.5 (GLOVE) ×1 IMPLANT
GLOVE SURG POLYISO LF SZ7 (GLOVE) ×1 IMPLANT
GLOVE SURG SIGNA 7.5 PF LTX (GLOVE) ×3 IMPLANT
GLOVE SURG UNDER POLY LF SZ6.5 (GLOVE) ×1 IMPLANT
GLOVE SURG UNDER POLY LF SZ7 (GLOVE) ×1 IMPLANT
GOWN STRL REUS W/ TWL LRG LVL3 (GOWN DISPOSABLE) ×2 IMPLANT
GOWN STRL REUS W/ TWL XL LVL3 (GOWN DISPOSABLE) ×2 IMPLANT
GOWN STRL REUS W/TWL LRG LVL3 (GOWN DISPOSABLE) ×4
GOWN STRL REUS W/TWL XL LVL3 (GOWN DISPOSABLE) ×2
NDL HYPO 25X1 1.5 SAFETY (NEEDLE) ×2 IMPLANT
NEEDLE HYPO 25X1 1.5 SAFETY (NEEDLE) ×2 IMPLANT
NS IRRIG 1000ML POUR BTL (IV SOLUTION) ×1 IMPLANT
PACK BASIN DAY SURGERY FS (CUSTOM PROCEDURE TRAY) ×3 IMPLANT
PENCIL SMOKE EVACUATOR (MISCELLANEOUS) ×3 IMPLANT
SLEEVE SCD COMPRESS KNEE MED (STOCKING) ×1 IMPLANT
SPONGE T-LAP 4X18 ~~LOC~~+RFID (SPONGE) ×4 IMPLANT
SUT MNCRL AB 4-0 PS2 18 (SUTURE) ×1 IMPLANT
SUT VIC AB 2-0 SH 27 (SUTURE)
SUT VIC AB 2-0 SH 27XBRD (SUTURE) IMPLANT
SUT VIC AB 3-0 SH 27 (SUTURE) ×2
SUT VIC AB 3-0 SH 27X BRD (SUTURE) IMPLANT
SYR CONTROL 10ML LL (SYRINGE) ×3 IMPLANT
TOWEL GREEN STERILE FF (TOWEL DISPOSABLE) ×3 IMPLANT
TUBE CONNECTING 20X1/4 (TUBING) ×1 IMPLANT
YANKAUER SUCT BULB TIP NO VENT (SUCTIONS) ×1 IMPLANT

## 2021-12-07 NOTE — Anesthesia Procedure Notes (Signed)
Procedure Name: LMA Insertion Date/Time: 12/07/2021 8:50 AM Performed by: Signe Colt, CRNA Pre-anesthesia Checklist: Patient identified, Emergency Drugs available, Suction available and Patient being monitored Patient Re-evaluated:Patient Re-evaluated prior to induction Oxygen Delivery Method: Circle System Utilized Preoxygenation: Pre-oxygenation with 100% oxygen Induction Type: IV induction Ventilation: Mask ventilation without difficulty LMA: LMA inserted LMA Size: 4.0 Number of attempts: 1 Airway Equipment and Method: bite block Placement Confirmation: positive ETCO2 Tube secured with: Tape Dental Injury: Teeth and Oropharynx as per pre-operative assessment

## 2021-12-07 NOTE — Interval H&P Note (Signed)
History and Physical Interval Note:no change in H and P  12/07/2021 7:46 AM  [name]  has presented today for surgery, with the diagnosis of RIGHT AXILLARY MASS.  The various methods of treatment have been discussed with the patient and family. After consideration of risks, benefits and other options for treatment, the patient has consented to  Procedure(s): EXCISION RIGHT AXILLARY MASS (Right) as a surgical intervention.  The patient's history has been reviewed, patient examined, no change in status, stable for surgery.  I have reviewed the patient's chart and labs.  Questions were answered to the patient's satisfaction.     Amy Weaver

## 2021-12-07 NOTE — Transfer of Care (Signed)
Immediate Anesthesia Transfer of Care Note  Patient: Amy Weaver  Procedure(s) Performed: EXCISION RIGHT AXILLARY MASS (Right: Axilla)  Patient Location: PACU  Anesthesia Type:General  Level of Consciousness: drowsy and patient cooperative  Airway & Oxygen Therapy: Patient Spontanous Breathing and Patient connected to face mask oxygen  Post-op Assessment: Report given to RN and Post -op Vital signs reviewed and stable  Post vital signs: Reviewed and stable  Last Vitals:  Vitals Value Taken Time  BP    Temp 36.5 C 12/07/21 0930  Pulse 100 12/07/21 0930  Resp 17 12/07/21 0930  SpO2 92 % 12/07/21 0930  Vitals shown include unvalidated device data.  Last Pain:  Vitals:   12/07/21 0759  TempSrc: Oral  PainSc: 0-No pain         Complications: No notable events documented.

## 2021-12-07 NOTE — Op Note (Signed)
EXCISION RIGHT AXILLARY MASS  Procedure Note  Amy Weaver 12/07/2021   Pre-op Diagnosis: RIGHT AXILLARY MASS     Post-op Diagnosis: 5 CM RIGHT AXILLARY CYST  Procedure(s): EXCISION RIGHT AXILLARY 5 CM CYST  Surgeon(s): Abigail Miyamoto, MD Hedda Slade, PA-C  Anesthesia: General  Staff:  Circulator: Maryan Rued, RN Scrub Person: Wardell Heath, CST  Estimated Blood Loss: Minimal               Specimens: SENT TO PATH  Findings: The right axillary mass was approximately 5 cm in size and was consistent with a sebaceous cyst.  It was superficial.  It was sent to pathology for evaluation  Procedure: The patient is brought to operating room identified as correct patient.  She is placed upon the operating table and anesthesia was induced.  She was then placed in the left lateral decubitus position.  Her posterior axilla and back were then prepped and draped in the usual sterile fashion on the right side.  I anesthetized the skin over the large palpable mass with Marcaine.  I then made an incision with a scalpel.  I then dissected down to the subtenons tissue electrocautery.  I immediately identified the mass and artery dissecting it free from the surrounding tissue.  The mass was entered and became evident that this was a sebaceous cyst containing a large amount of sebaceous debris.  I was able to excise the cyst in its entirety with electrocautery.  Once the cyst was removed we irrigated the cavity with saline.  All sebaceous debris and cyst wall appear to be excised.  Hemostasis appeared to be achieved.  The wound was anesthetized further with Marcaine.  The subcutaneous tissue was then closed interrupted 3-0 Vicryl sutures and the skin was closed with a running 4-0 Monocryl.  Dermabond was then applied.  The patient tolerated the procedure well.  All the counts were correct at the end of the procedure.  The patient was then extubated in the operating room and taken in stable addition to  the recovery room.          Abigail Miyamoto   Date: 12/07/2021  Time: 9:15 AM

## 2021-12-07 NOTE — Discharge Instructions (Addendum)
You may shower starting tomorrow  Ice pack, Tylenol, and ibuprofen also for pain  No vigorous activity for 1 week  May take Tylenol after 2pm, if needed.    Post Anesthesia Home Care Instructions  Activity: Get plenty of rest for the remainder of the day. A responsible individual must stay with you for 24 hours following the procedure.  For the next 24 hours, DO NOT: -Drive a car -Advertising copywriter -Drink alcoholic beverages -Take any medication unless instructed by your physician -Make any legal decisions or sign important papers.  Meals: Start with liquid foods such as gelatin or soup. Progress to regular foods as tolerated. Avoid greasy, spicy, heavy foods. If nausea and/or vomiting occur, drink only clear liquids until the nausea and/or vomiting subsides. Call your physician if vomiting continues.  Special Instructions/Symptoms: Your throat may feel dry or sore from the anesthesia or the breathing tube placed in your throat during surgery. If this causes discomfort, gargle with warm salt water. The discomfort should disappear within 24 hours.  If you had a scopolamine patch placed behind your ear for the management of post- operative nausea and/or vomiting:  1. The medication in the patch is effective for 72 hours, after which it should be removed.  Wrap patch in a tissue and discard in the trash. Wash hands thoroughly with soap and water. 2. You may remove the patch earlier than 72 hours if you experience unpleasant side effects which may include dry mouth, dizziness or visual disturbances. 3. Avoid touching the patch. Wash your hands with soap and water after contact with the patch.

## 2021-12-07 NOTE — Anesthesia Postprocedure Evaluation (Signed)
Anesthesia Post Note  Patient: Amy Weaver  Procedure(s) Performed: EXCISION RIGHT AXILLARY MASS (Right: Axilla)     Patient location during evaluation: PACU Anesthesia Type: General Level of consciousness: awake and alert Pain management: pain level controlled Vital Signs Assessment: post-procedure vital signs reviewed and stable Respiratory status: spontaneous breathing, nonlabored ventilation and respiratory function stable Cardiovascular status: blood pressure returned to baseline Postop Assessment: no apparent nausea or vomiting Anesthetic complications: no   No notable events documented.  Last Vitals:  Vitals:   12/07/21 0946 12/07/21 1000  BP: (!) 100/57 113/67  Pulse:  91  Resp:  11  Temp:    SpO2:  94%    Last Pain:  Vitals:   12/07/21 1000  TempSrc:   PainSc: 0-No pain                 Shanda Howells

## 2021-12-08 ENCOUNTER — Encounter (HOSPITAL_BASED_OUTPATIENT_CLINIC_OR_DEPARTMENT_OTHER): Payer: Self-pay | Admitting: Surgery

## 2021-12-09 LAB — SURGICAL PATHOLOGY

## 2022-05-11 IMAGING — MG MM DIGITAL SCREENING BILAT W/ TOMO AND CAD
6 of 10 series · 6 of 30 positions shown · non-contrast
Comparison: Previous exam(s).

CLINICAL DATA: Screening.

EXAM:
DIGITAL SCREENING BILATERAL MAMMOGRAM WITH TOMOSYNTHESIS AND CAD
TECHNIQUE: Bilateral screening digital craniocaudal and mediolateral oblique
mammograms were obtained. Bilateral screening digital breast
tomosynthesis was performed. The images were evaluated with
computer-aided detection.

[R CC synth-2D (1 of 2)]
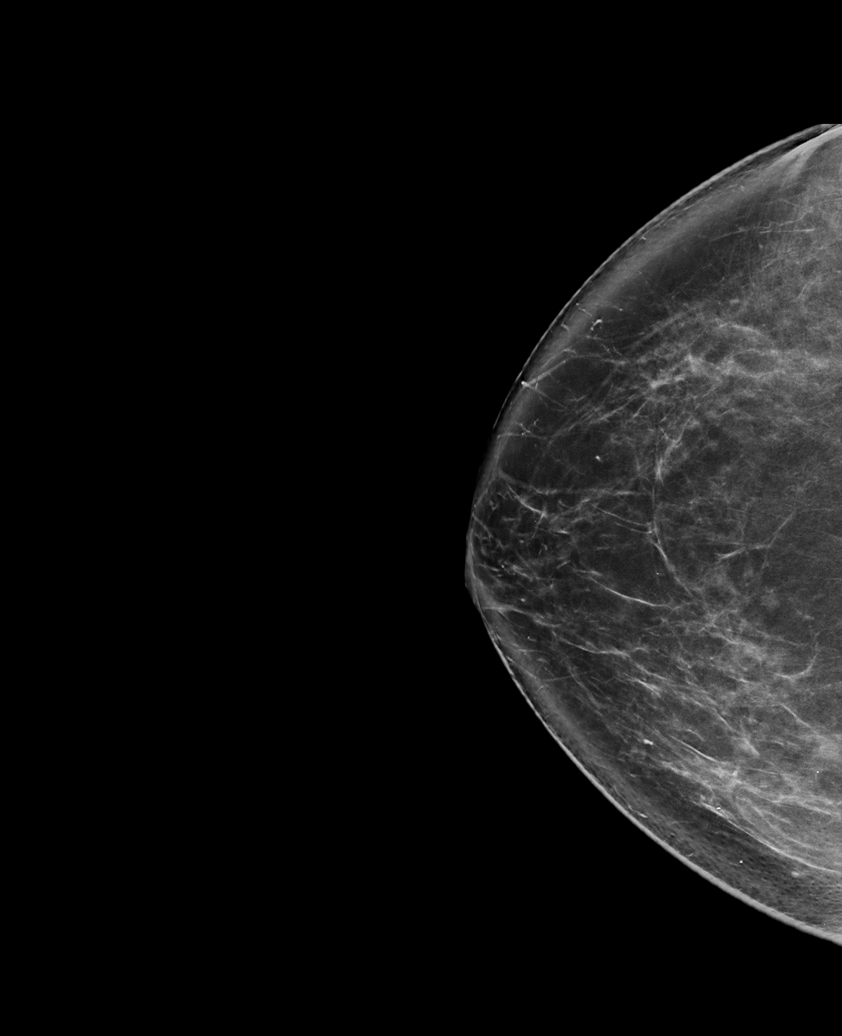

[R CC synth-2D (2 of 2)]
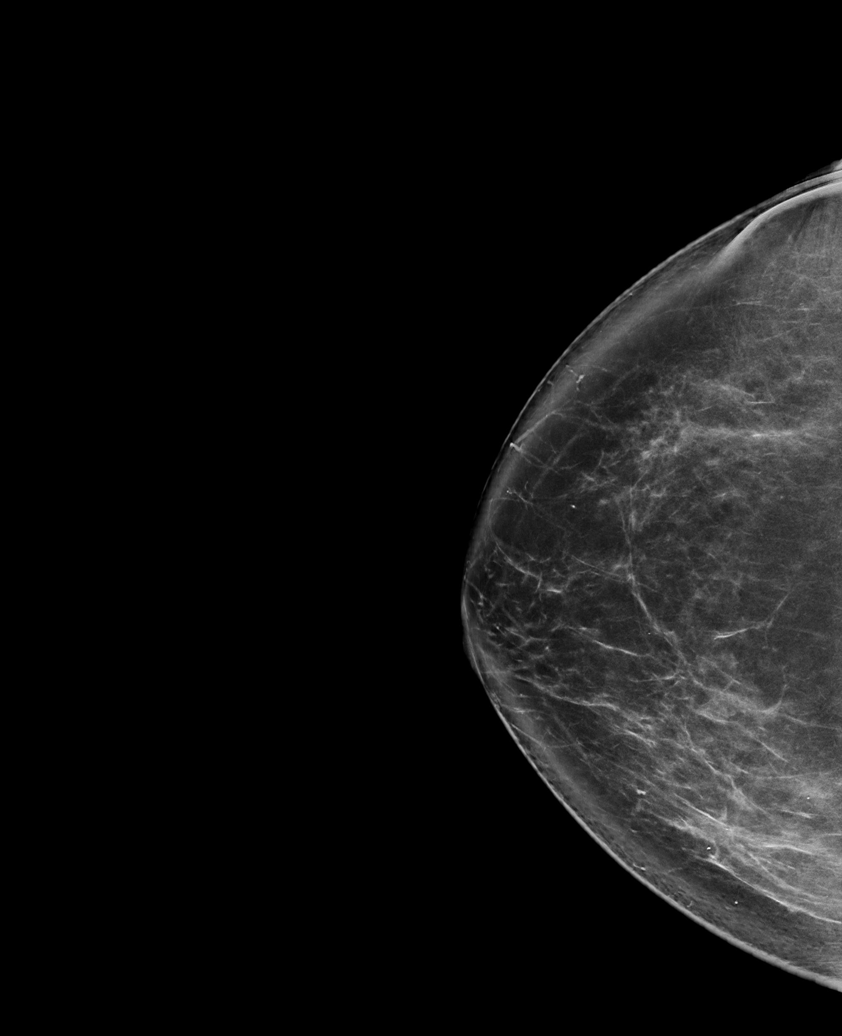

[L CC synth-2D]
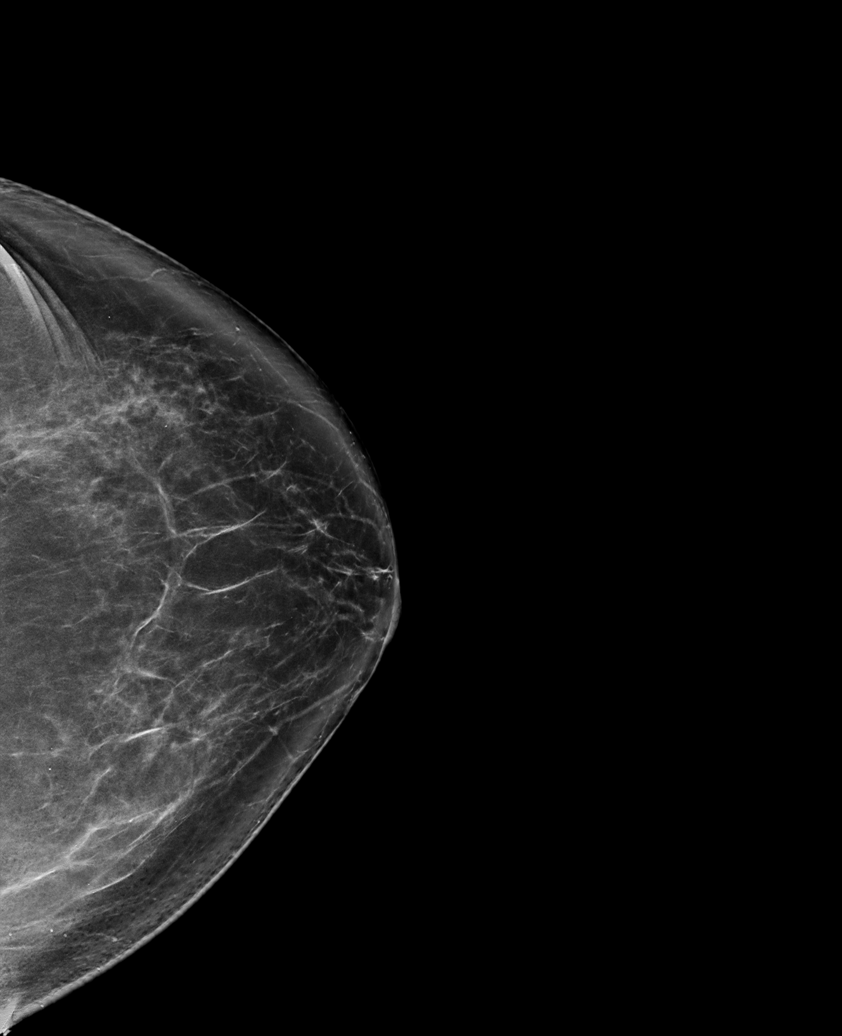

[L MLO synth-2D]
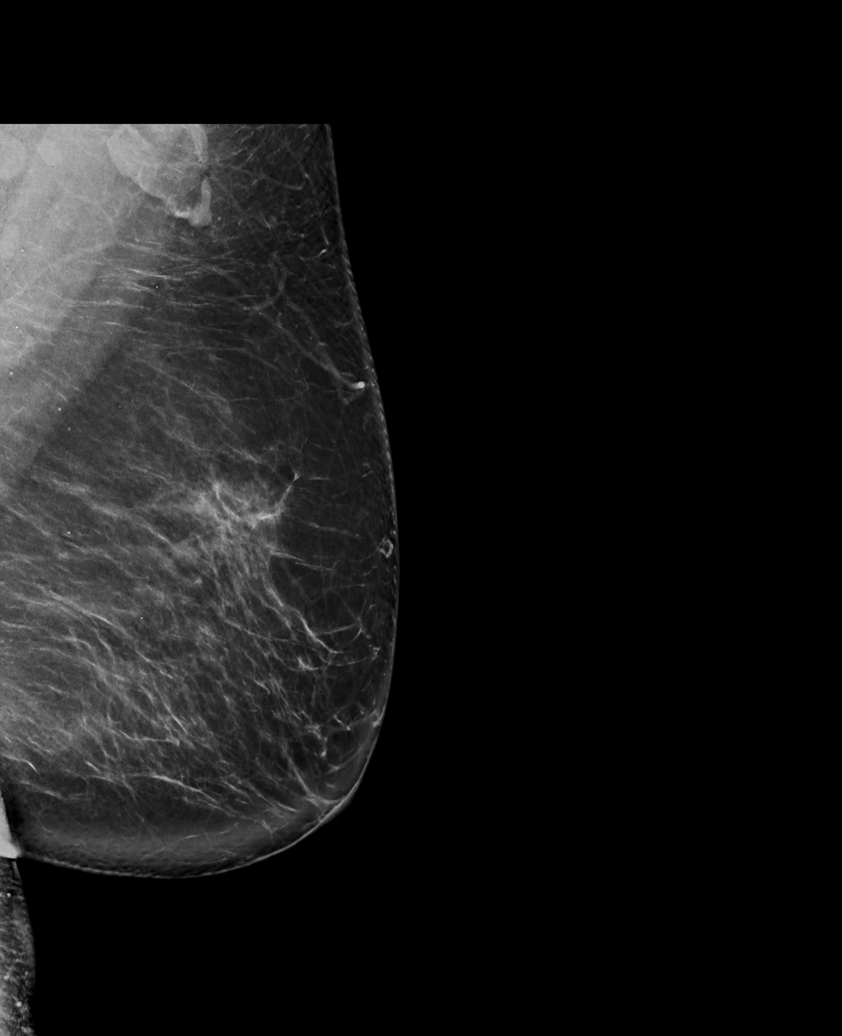

[R MLO synth-2D]
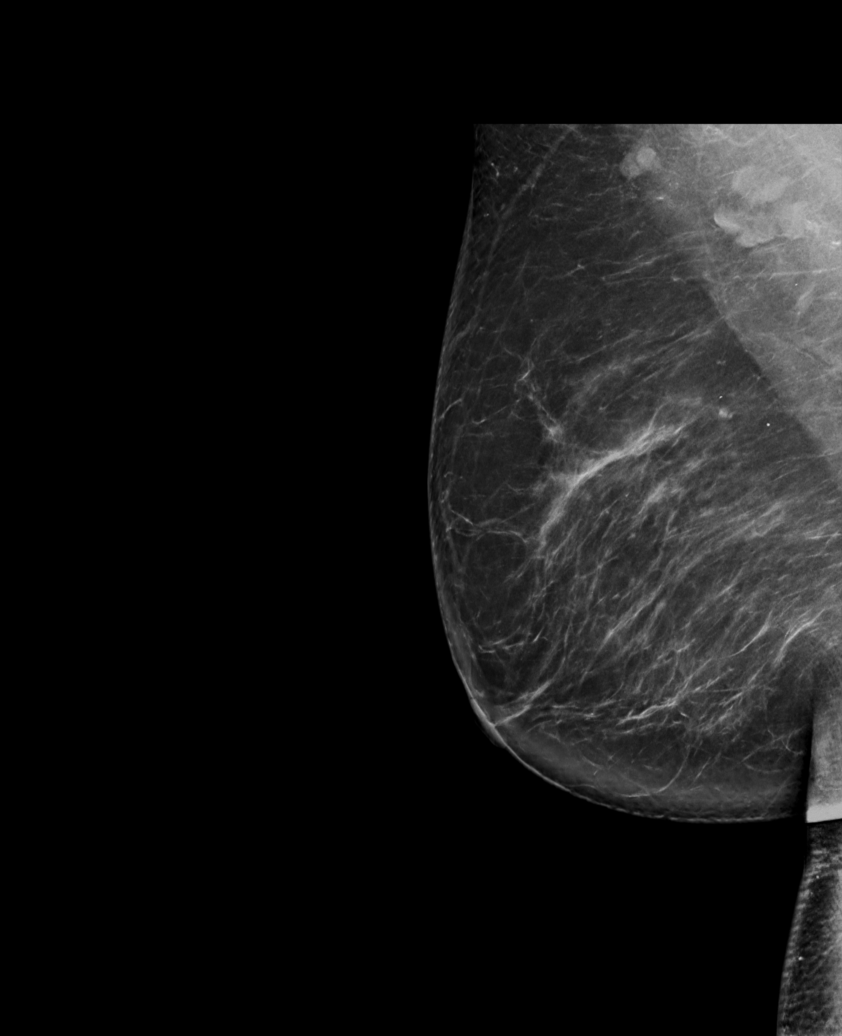

[L CC tomo · tomo slice 44/87.0]
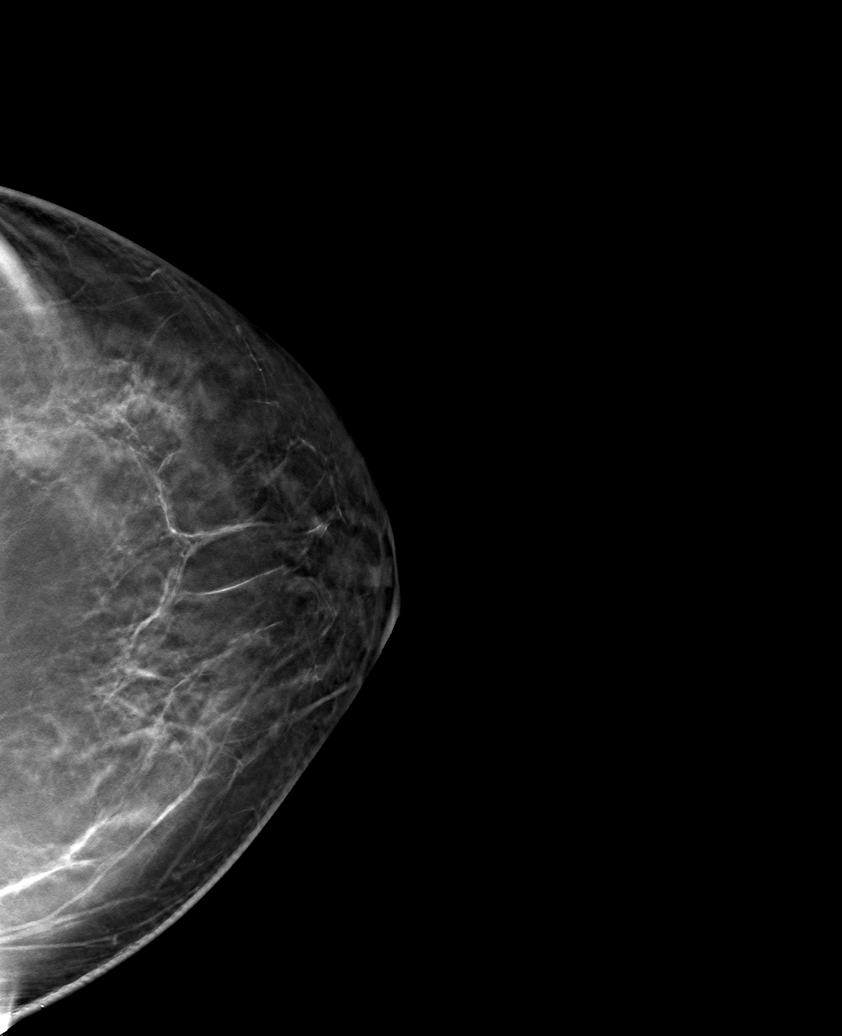

[6 of 30 positions shown; findings below may reference images not displayed]

ACR Breast Density Category b: There are scattered areas of
fibroglandular density.
FINDINGS: In the right axilla, a possible mass warrants further evaluation. In
the left breast, no findings suspicious for malignancy.
IMPRESSION: Further evaluation is suggested for possible mass in the right
axilla.

RECOMMENDATION:
Ultrasound of the right axilla. (Code:[id number])

The patient will be contacted regarding the findings, and additional
imaging will be scheduled.

BI-RADS CATEGORY  0: Incomplete. Need additional imaging evaluation
and/or prior mammograms for comparison.

## 2022-05-30 IMAGING — US US AXILLARY RIGHT
1 series · 13 of 14 positions shown · non-contrast
Comparison: No prior RIGHT axillary ultrasound. Mammography
08/18/2021 and earlier.

CLINICAL DATA: Recall from screening mammography, possible mass or
lymphadenopathy in the RIGHT axilla. The patient states that she has
had a palpable lump in the RIGHT axilla for many years.

EXAM:
ULTRASOUND OF THE RIGHT AXILLA

[Series 1: us axillary right · 0.07mm/px · 13 of 14 slices shown]
[im 1/14]
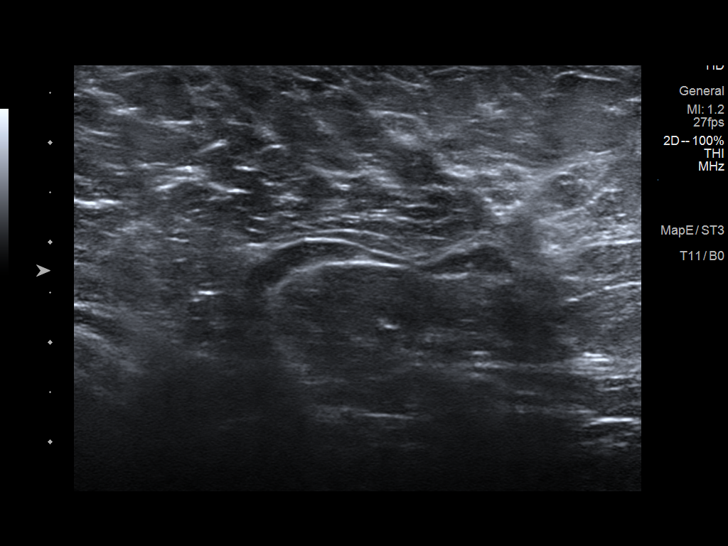
[im 2/14]
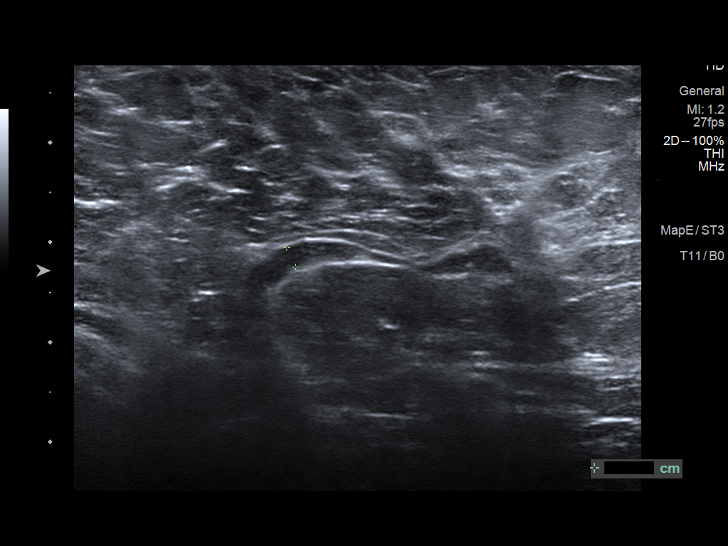
[im 3/14]
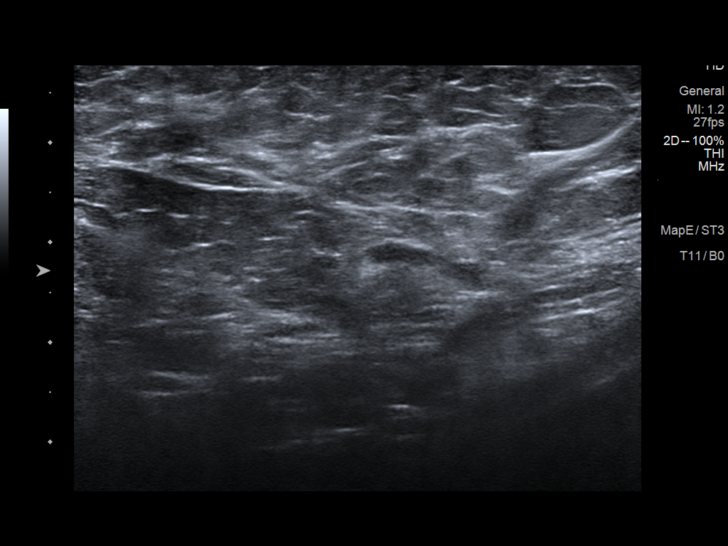
[im 4/14]
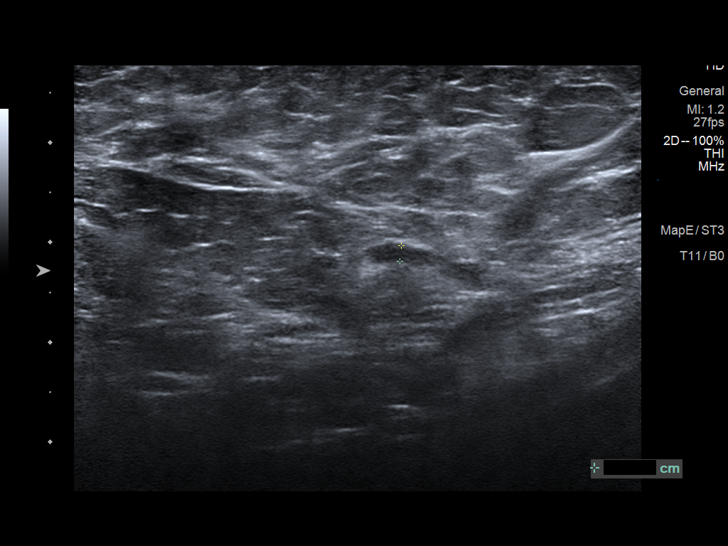
[im 5/14]
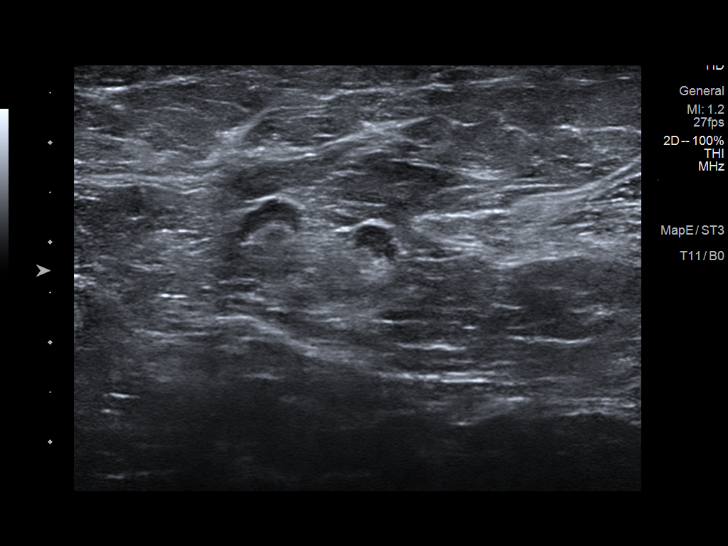
[im 6/14]
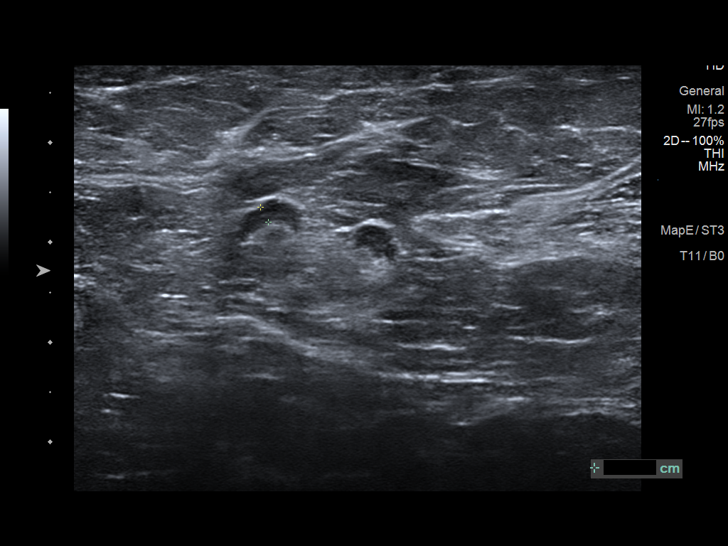
[im 8/14]
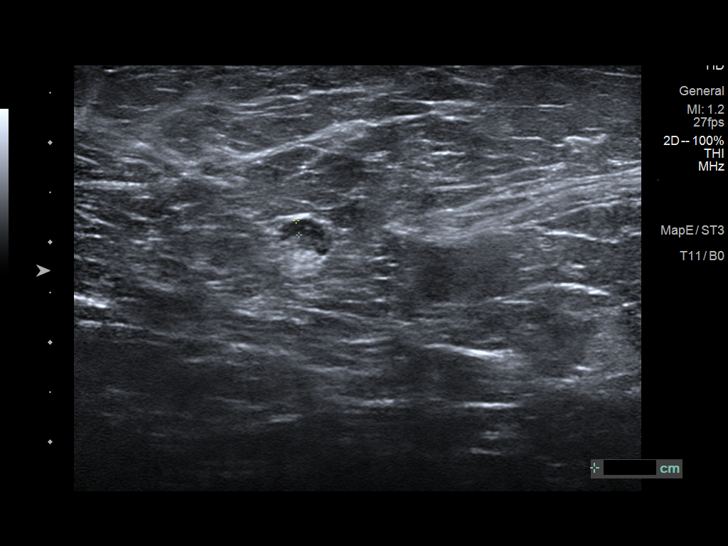
[im 9/14]
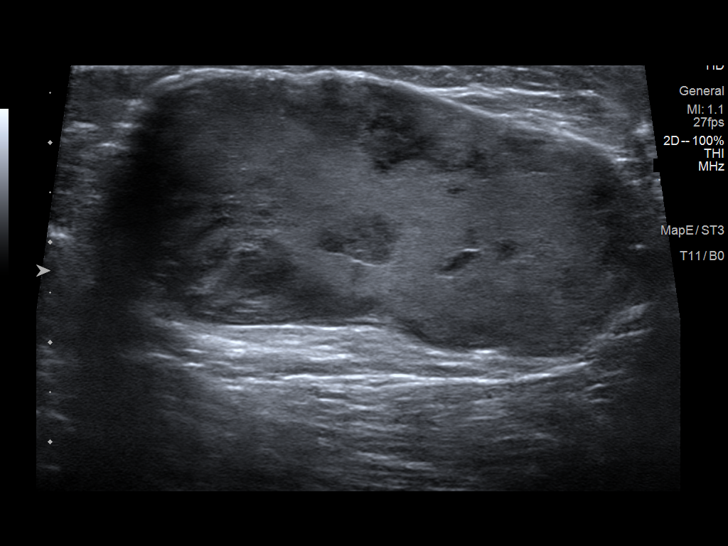
[im 10/14]
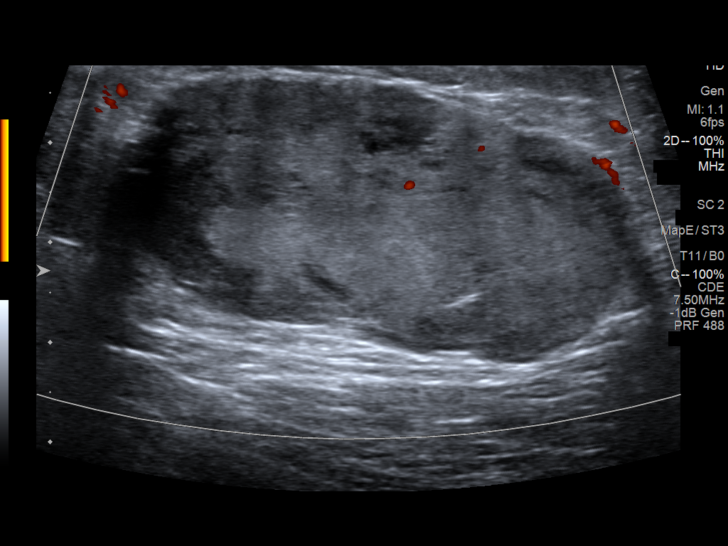
[im 11/14]
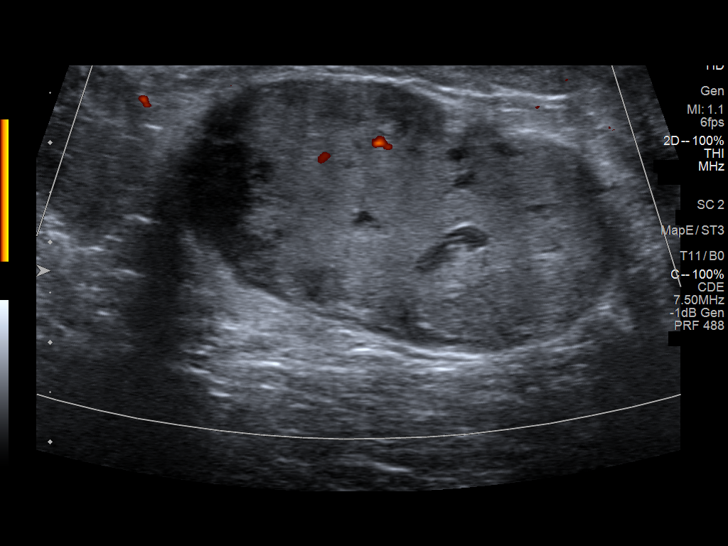
[im 12/14]
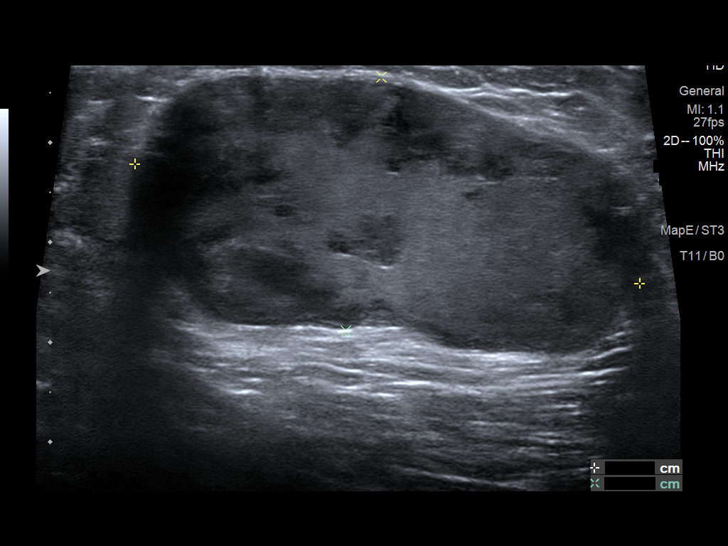
[im 13/14]
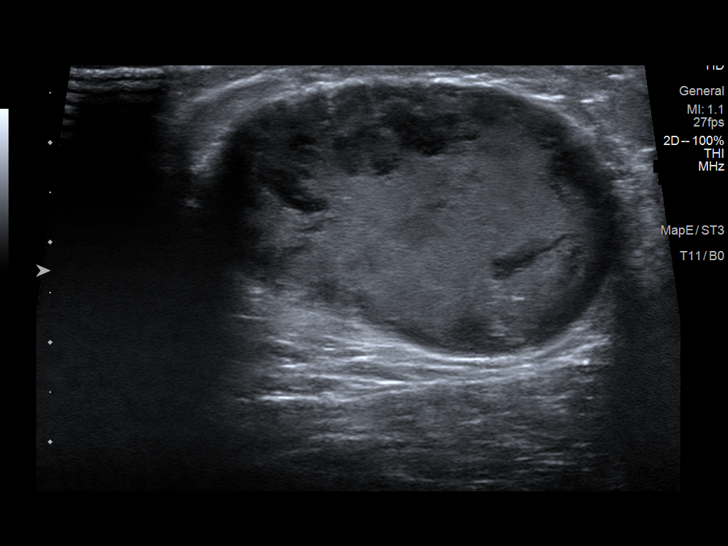
[im 14/14]
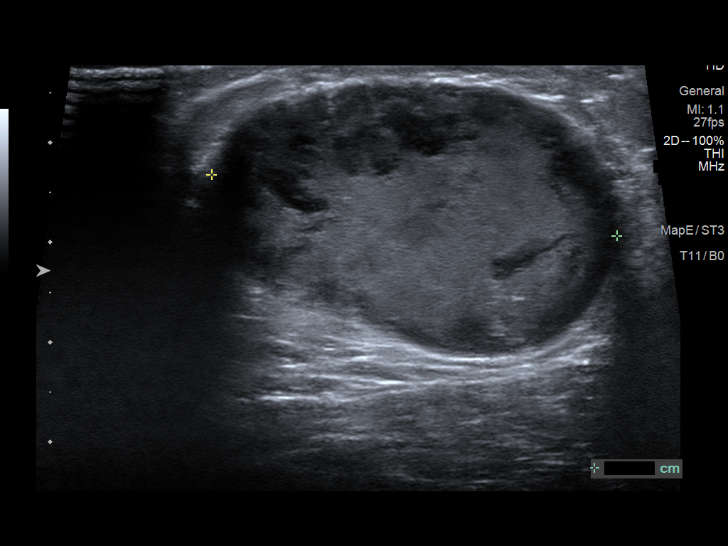

[13 of 14 positions shown; findings below may reference images not displayed]

FINDINGS: On correlative physical exam, there is a firm palpable mass in the
RIGHT axilla.

Ultrasound is performed, showing an oval heterogeneous though
predominantly hypoechoic mass in the RIGHT axilla measuring
approximately 5.2 x 2.6 x 4.1 cm, demonstrating posterior acoustic
enhancement and demonstrating internal power Doppler flow,
accounting for the screening mammographic finding.

There is no pathologic lymphadenopathy elsewhere in the RIGHT
axilla.
IMPRESSION: Indeterminate 5.2 cm mass involving the RIGHT axilla. While the
patient states that she has had a long-standing axillary mass, the
fact that it became visible on the current screening mammogram (and
not visible previously) raises the question of increasing size.

The mass may be of mesenchymal origin rather than representing an
enlarged lymph node.

RECOMMENDATION:
Ultrasound-guided core needle biopsy of the RIGHT axillary mass.

I have discussed the findings and recommendations with the patient.
The ultrasound core needle biopsy procedure was discussed with
patient and her questions were answered. She wishes to proceed with
the biopsy which has been scheduled by the [REDACTED] staff.

BI-RADS CATEGORY  4: Suspicious.

## 2022-07-20 DIAGNOSIS — E7800 Pure hypercholesterolemia, unspecified: Secondary | ICD-10-CM | POA: Diagnosis not present

## 2022-07-20 DIAGNOSIS — Z0000 Encounter for general adult medical examination without abnormal findings: Secondary | ICD-10-CM | POA: Diagnosis not present

## 2022-07-20 DIAGNOSIS — Z23 Encounter for immunization: Secondary | ICD-10-CM | POA: Diagnosis not present

## 2022-07-20 DIAGNOSIS — R7302 Impaired glucose tolerance (oral): Secondary | ICD-10-CM | POA: Diagnosis not present

## 2022-07-20 DIAGNOSIS — E039 Hypothyroidism, unspecified: Secondary | ICD-10-CM | POA: Diagnosis not present

## 2022-08-22 DIAGNOSIS — H40013 Open angle with borderline findings, low risk, bilateral: Secondary | ICD-10-CM | POA: Diagnosis not present

## 2022-09-05 DIAGNOSIS — E1169 Type 2 diabetes mellitus with other specified complication: Secondary | ICD-10-CM | POA: Diagnosis not present

## 2022-09-05 DIAGNOSIS — Z23 Encounter for immunization: Secondary | ICD-10-CM | POA: Diagnosis not present

## 2022-09-05 DIAGNOSIS — E039 Hypothyroidism, unspecified: Secondary | ICD-10-CM | POA: Diagnosis not present

## 2022-09-14 ENCOUNTER — Other Ambulatory Visit: Payer: Self-pay | Admitting: Physician Assistant

## 2022-09-14 DIAGNOSIS — Z1231 Encounter for screening mammogram for malignant neoplasm of breast: Secondary | ICD-10-CM

## 2022-11-15 DIAGNOSIS — H52223 Regular astigmatism, bilateral: Secondary | ICD-10-CM | POA: Diagnosis not present

## 2022-11-15 DIAGNOSIS — H25813 Combined forms of age-related cataract, bilateral: Secondary | ICD-10-CM | POA: Diagnosis not present

## 2022-11-15 DIAGNOSIS — H40033 Anatomical narrow angle, bilateral: Secondary | ICD-10-CM | POA: Diagnosis not present

## 2022-11-16 ENCOUNTER — Ambulatory Visit
Admission: RE | Admit: 2022-11-16 | Discharge: 2022-11-16 | Disposition: A | Discharge status: Home / Self Care | Payer: BC Managed Care – PPO | Source: Ambulatory Visit | Attending: Physician Assistant | Admitting: Physician Assistant

## 2022-11-16 DIAGNOSIS — Z1231 Encounter for screening mammogram for malignant neoplasm of breast: Secondary | ICD-10-CM | POA: Diagnosis not present

## 2022-11-21 DIAGNOSIS — H25813 Combined forms of age-related cataract, bilateral: Secondary | ICD-10-CM | POA: Diagnosis not present

## 2022-11-28 DIAGNOSIS — Z6833 Body mass index (BMI) 33.0-33.9, adult: Secondary | ICD-10-CM | POA: Diagnosis not present

## 2022-11-28 DIAGNOSIS — H25813 Combined forms of age-related cataract, bilateral: Secondary | ICD-10-CM | POA: Diagnosis not present

## 2022-11-28 DIAGNOSIS — H40033 Anatomical narrow angle, bilateral: Secondary | ICD-10-CM | POA: Diagnosis not present

## 2022-11-28 DIAGNOSIS — Z888 Allergy status to other drugs, medicaments and biological substances status: Secondary | ICD-10-CM | POA: Diagnosis not present

## 2022-11-28 DIAGNOSIS — E785 Hyperlipidemia, unspecified: Secondary | ICD-10-CM | POA: Diagnosis not present

## 2022-11-28 DIAGNOSIS — H25811 Combined forms of age-related cataract, right eye: Secondary | ICD-10-CM | POA: Diagnosis not present

## 2022-11-28 DIAGNOSIS — Z79890 Hormone replacement therapy (postmenopausal): Secondary | ICD-10-CM | POA: Diagnosis not present

## 2022-11-28 DIAGNOSIS — I10 Essential (primary) hypertension: Secondary | ICD-10-CM | POA: Diagnosis not present

## 2022-11-28 DIAGNOSIS — E039 Hypothyroidism, unspecified: Secondary | ICD-10-CM | POA: Diagnosis not present

## 2022-11-28 DIAGNOSIS — H52223 Regular astigmatism, bilateral: Secondary | ICD-10-CM | POA: Diagnosis not present

## 2022-11-28 DIAGNOSIS — Z79899 Other long term (current) drug therapy: Secondary | ICD-10-CM | POA: Diagnosis not present

## 2022-11-28 DIAGNOSIS — E669 Obesity, unspecified: Secondary | ICD-10-CM | POA: Diagnosis not present

## 2023-01-26 DIAGNOSIS — E1169 Type 2 diabetes mellitus with other specified complication: Secondary | ICD-10-CM | POA: Diagnosis not present

## 2023-01-26 DIAGNOSIS — E7800 Pure hypercholesterolemia, unspecified: Secondary | ICD-10-CM | POA: Diagnosis not present

## 2023-01-26 DIAGNOSIS — E039 Hypothyroidism, unspecified: Secondary | ICD-10-CM | POA: Diagnosis not present

## 2023-08-03 DIAGNOSIS — Z23 Encounter for immunization: Secondary | ICD-10-CM | POA: Diagnosis not present

## 2023-08-03 DIAGNOSIS — E039 Hypothyroidism, unspecified: Secondary | ICD-10-CM | POA: Diagnosis not present

## 2023-08-03 DIAGNOSIS — Z124 Encounter for screening for malignant neoplasm of cervix: Secondary | ICD-10-CM | POA: Diagnosis not present

## 2023-08-03 DIAGNOSIS — E7800 Pure hypercholesterolemia, unspecified: Secondary | ICD-10-CM | POA: Diagnosis not present

## 2023-08-03 DIAGNOSIS — D229 Melanocytic nevi, unspecified: Secondary | ICD-10-CM | POA: Diagnosis not present

## 2023-08-03 DIAGNOSIS — E1169 Type 2 diabetes mellitus with other specified complication: Secondary | ICD-10-CM | POA: Diagnosis not present

## 2023-08-03 DIAGNOSIS — Z0000 Encounter for general adult medical examination without abnormal findings: Secondary | ICD-10-CM | POA: Diagnosis not present

## 2023-08-31 DIAGNOSIS — D229 Melanocytic nevi, unspecified: Secondary | ICD-10-CM | POA: Diagnosis not present

## 2023-10-22 ENCOUNTER — Other Ambulatory Visit: Payer: Self-pay | Admitting: Physician Assistant

## 2023-10-22 DIAGNOSIS — Z1231 Encounter for screening mammogram for malignant neoplasm of breast: Secondary | ICD-10-CM

## 2023-11-09 DIAGNOSIS — E7800 Pure hypercholesterolemia, unspecified: Secondary | ICD-10-CM | POA: Diagnosis not present

## 2023-11-23 ENCOUNTER — Ambulatory Visit
Admission: RE | Admit: 2023-11-23 | Discharge: 2023-11-23 | Disposition: A | Discharge status: Home / Self Care | Payer: BC Managed Care – PPO | Source: Ambulatory Visit | Attending: Physician Assistant | Admitting: Physician Assistant

## 2023-11-23 DIAGNOSIS — Z1231 Encounter for screening mammogram for malignant neoplasm of breast: Secondary | ICD-10-CM | POA: Diagnosis not present

## 2024-01-31 DIAGNOSIS — E039 Hypothyroidism, unspecified: Secondary | ICD-10-CM | POA: Diagnosis not present

## 2024-01-31 DIAGNOSIS — E7800 Pure hypercholesterolemia, unspecified: Secondary | ICD-10-CM | POA: Diagnosis not present

## 2024-01-31 DIAGNOSIS — E1169 Type 2 diabetes mellitus with other specified complication: Secondary | ICD-10-CM | POA: Diagnosis not present

## 2024-08-07 DIAGNOSIS — E7800 Pure hypercholesterolemia, unspecified: Secondary | ICD-10-CM | POA: Diagnosis not present

## 2024-08-07 DIAGNOSIS — Z23 Encounter for immunization: Secondary | ICD-10-CM | POA: Diagnosis not present

## 2024-08-07 DIAGNOSIS — Z0000 Encounter for general adult medical examination without abnormal findings: Secondary | ICD-10-CM | POA: Diagnosis not present

## 2024-08-07 DIAGNOSIS — E039 Hypothyroidism, unspecified: Secondary | ICD-10-CM | POA: Diagnosis not present

## 2024-08-07 DIAGNOSIS — E1169 Type 2 diabetes mellitus with other specified complication: Secondary | ICD-10-CM | POA: Diagnosis not present

## 2024-11-14 ENCOUNTER — Other Ambulatory Visit: Payer: Self-pay | Admitting: Physician Assistant

## 2024-11-14 DIAGNOSIS — Z1231 Encounter for screening mammogram for malignant neoplasm of breast: Secondary | ICD-10-CM

## 2024-12-02 ENCOUNTER — Ambulatory Visit

## 2024-12-08 ENCOUNTER — Ambulatory Visit

## 2024-12-12 ENCOUNTER — Ambulatory Visit: Admission: RE | Admit: 2024-12-12 | Source: Ambulatory Visit

## 2024-12-12 DIAGNOSIS — Z1231 Encounter for screening mammogram for malignant neoplasm of breast: Secondary | ICD-10-CM
